# Patient Record
Sex: Female | Born: 2000 | Race: Black or African American | Hispanic: No | Marital: Single | Smoking: Never smoker
Health system: Southern US, Community
[De-identification: ages and names within clinical notes are randomized; demographics above are authoritative.]

## PROBLEM LIST (undated history)

## (undated) ENCOUNTER — Inpatient Hospital Stay (HOSPITAL_COMMUNITY): Payer: Self-pay

## (undated) ENCOUNTER — Ambulatory Visit: Admission: EM | Discharge status: Absent without leave | Payer: Medicaid Other

## (undated) DIAGNOSIS — J302 Other seasonal allergic rhinitis: Secondary | ICD-10-CM

## (undated) DIAGNOSIS — R7303 Prediabetes: Secondary | ICD-10-CM

## (undated) DIAGNOSIS — J45909 Unspecified asthma, uncomplicated: Secondary | ICD-10-CM

## (undated) HISTORY — PX: COMBINED REDUCTION MAMMAPLASTY W/ ABDOMINOPLASTY: SUR306

## (undated) HISTORY — DX: Unspecified asthma, uncomplicated: J45.909

## (undated) HISTORY — DX: Other seasonal allergic rhinitis: J30.2

---
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
## 2021-01-29 DIAGNOSIS — Z5181 Encounter for therapeutic drug level monitoring: Secondary | ICD-10-CM | POA: Insufficient documentation

## 2021-05-29 ENCOUNTER — Encounter: Payer: Self-pay | Admitting: Registered"

## 2021-05-29 ENCOUNTER — Other Ambulatory Visit: Payer: Self-pay

## 2021-05-29 ENCOUNTER — Encounter: Payer: Medicaid Other | Attending: Physician Assistant | Admitting: Registered"

## 2021-05-29 DIAGNOSIS — R635 Abnormal weight gain: Secondary | ICD-10-CM | POA: Diagnosis present

## 2021-05-29 NOTE — Patient Instructions (Signed)
-   Aim to have 1/2 plate of non-starchy vegetables + 1/4 plate of carbohydrates + 1/4 plate of lean protein with lunch and dinner daily.   - Aim to have source of protein and carbohydrates with breakfast daily.  Ex. Bagel + peanut butter + [place] bacon + water  - Aim to have at least 3 meals a day.   - Drink a bottle of water with each meal.   - Great job with with physical activity!

## 2021-05-29 NOTE — Progress Notes (Signed)
Medical Nutrition Therapy  Appointment Start time:  8:35  Appointment End time:  9:45  Primary concerns today: how to eat healthier  Referral diagnosis: abnormal weight gain, excessive hunger Preferred learning style:  no preference indicated Learning readiness: ready, change in progress   NUTRITION ASSESSMENT   Pt arrives stating she gets boils when she eats red meat. States was told that her blood sugar numbers are elevated to almost being in diabetes range; states A1c was abnormal. States she did not originally know A1c value but logged into mobile account during our appt and A1c result from 04/24/21 was 6.6. States she will return to PCP in 3 months (07/2021) for follow-up.   States she has increased gym regimen since finding out about A1c results. Reports going to gym for 2-3 hours, 4-5 times/week. States she tries to drink more smoothies. States she does not know what to eat, how much to eat, how to lose the weight and keep it off. States she has never been like a meat person. States she has been trying to drink more water. States she was not drinking a lot of water or does not drink because she is not thirsty. States she typically drinks coffee (no sugar), water, Powerade, or Glucerna. States she tries to drink a smoothie or eat a little bit of something before going to gym to not feel sick. States she tries not to eat after 7 pm but also tries not to eat anything heavy after 10 pm. States she doesn't eat sweets because it upsets her stomach.    Clinical Medical Hx: none listed Medications: See list Labs: elevated A1c (6.6) Notable Signs/Symptoms: increased hunger  Lifestyle & Dietary Hx  Estimated daily fluid intake: 10 oz Supplements: See list Sleep: 7-8 hrs/night Stress / self-care: gym  Current average weekly physical activity: cardio + strength training 60+ min, 4-5 days/week  Safe foods: shrimp, salads, yams, macaroni and cheese, mashed potatoes, greens, potato salad, chicken  (fried or grilled), fish (fried or baked), salami, [place] bacon, chicken alfredo, broccoli, broccoli and cheese, fruit,   Avoided foods: beef, cheese, yogurt  24-Hr Dietary Recall First Meal: Glucerna + cup of fruit Snack:  Second Meal: Red Crab - 5 boiled shrimp + red potato + corn on cob Snack:  Third Meal: Red Crab - 5 boiled shrimp Snack:  Beverages: Glucerna, water (16-20 oz)   NUTRITION DIAGNOSIS  NB-1.1 Food and nutrition-related knowledge deficit As related to elevated A1c.  As evidenced by pt verbalizes lack of previous nutrition-related education.   NUTRITION INTERVENTION  Nutrition education (E-1) on the following topics: Pt was educated and counseled on metabolism, importance of not skipping meals, how carbohydrates work in the body, A1c ranges for prediabetes/diabetes, role of fiber in eating regimen, and importance of physical activity. Discussed meal planning and how to balance meals using MyPlate for prediabetes. Pt agreed with goals listed.  Handouts Provided Include  My Plate for Prediabetes  Learning Style & Readiness for Change Teaching method utilized: Visual & Auditory  Demonstrated degree of understanding via: Teach Back  Barriers to learning/adherence to lifestyle change: none identified  Goals Established by Pt Aim to have 1/2 plate of non-starchy vegetables + 1/4 plate of carbohydrates + 1/4 plate of lean protein with lunch and dinner daily.  Aim to have source of protein and carbohydrates with breakfast daily.  Ex. Bagel + peanut butter + [place] bacon + water Aim to have at least 3 meals a day.  Drink a bottle of  water with each meal.  Great job with with physical activity!   MONITORING & EVALUATION Dietary intake, weekly physical activity.  Next Steps  Patient is to follow-up in 4 weeks.

## 2021-06-12 ENCOUNTER — Telehealth: Payer: Medicaid Other | Admitting: Family

## 2021-06-12 DIAGNOSIS — B349 Viral infection, unspecified: Secondary | ICD-10-CM

## 2021-06-12 DIAGNOSIS — R6889 Other general symptoms and signs: Secondary | ICD-10-CM

## 2021-06-12 MED ORDER — PREDNISONE 10 MG (21) PO TBPK: ORAL_TABLET | ORAL | 0 refills | Status: DC

## 2021-06-12 MED ORDER — BENZONATATE 100 MG PO CAPS: 100.0000 mg | ORAL_CAPSULE | Freq: Three times a day (TID) | ORAL | 0 refills | Status: DC | PRN

## 2021-06-12 NOTE — Progress Notes (Signed)
Virtual Visit Consent   [name], you are scheduled for a virtual visit with a [facility] provider today.     Just as with appointments in the office, your consent must be obtained to participate.  Your consent will be active for this visit and any virtual visit you may have with one of our providers in the next 365 days.     If you have a MyChart account, a copy of this consent can be sent to you electronically.  All virtual visits are billed to your insurance company just like a traditional visit in the office.    As this is a virtual visit, video technology does not allow for your provider to perform a traditional examination.  This may limit your provider's ability to fully assess your condition.  If your provider identifies any concerns that need to be evaluated in person or the need to arrange testing (such as labs, EKG, etc.), we will make arrangements to do so.     Although advances in technology are sophisticated, we cannot ensure that it will always work on either your end or our end.  If the connection with a video visit is poor, the visit may have to be switched to a telephone visit.  With either a video or telephone visit, we are not always able to ensure that we have a secure connection.     I need to obtain your verbal consent now.   Are you willing to proceed with your visit today?    RIANA Sanders has provided verbal consent on 06/12/2021 for a virtual visit (video or telephone).   Jannifer Rodney, FNP   Date: 06/12/2021 10:12 AM   Virtual Visit via Video Note   I, Jannifer Rodney, connected with  Anna Sanders  ([id number], 21-Oct-2000) on 06/12/21 at 10:15 AM EST by a video-enabled telemedicine application and verified that I am speaking with the correct person using two identifiers.  Location: Patient: Virtual Visit Location Patient: Home Provider: Virtual Visit Location Provider: Home Office   I discussed the limitations of evaluation and management by  telemedicine and the availability of in person appointments. The patient expressed understanding and agreed to proceed.    History of Present Illness: Anna Sanders is a 20 y.o. who identifies as a female who was assigned female at birth, and is being seen today for headache, sore throat, body pain that started last week. She went to her doctor on 06/05/21 and was negative for COVID and Flu. She was give albuterol. She completed doxycyline 05/29/21.  HPI: Cough This is a new problem. The current episode started 1 to 4 weeks ago. The problem has been unchanged. Associated symptoms include chills, a fever, headaches, myalgias, nasal congestion, postnasal drip and wheezing. Pertinent negatives include no ear congestion, ear pain or shortness of breath.   Problems: There are no problems to display for this patient.   Allergies:  Allergies  Allergen Reactions   Apple     Face breaks out   Latex     itching   Medications:  Current Outpatient Medications:    benzonatate (TESSALON PERLES) 100 MG capsule, Take 1 capsule (100 mg total) by mouth 3 (three) times daily as needed., Disp: 20 capsule, Rfl: 0   predniSONE (STERAPRED UNI-PAK 21 TAB) 10 MG (21) TBPK tablet, Use as directed, Disp: 21 tablet, Rfl: 0   albuterol (VENTOLIN HFA) 108 (90 Base) MCG/ACT inhaler, Inhale into the lungs every 6 (six) hours as  needed for wheezing or shortness of breath., Disp: , Rfl:    doxycycline (VIBRAMYCIN) 100 MG capsule, Take 100 mg by mouth 2 (two) times daily., Disp: , Rfl:    etonogestrel (NEXPLANON) 68 MG IMPL implant, 1 each by Subdermal route once., Disp: , Rfl:    fexofenadine-pseudoephedrine (ALLEGRA-D 24) 180-240 MG 24 hr tablet, Take 1 tablet by mouth daily., Disp: , Rfl:    fluticasone (FLOVENT HFA) 44 MCG/ACT inhaler, Inhale into the lungs 2 (two) times daily., Disp: , Rfl:    ibuprofen (ADVIL,MOTRIN) 600 MG tablet, Take 1 tablet (600 mg total) by mouth every 6 (six) hours as needed for mild pain or  moderate pain., Disp: 30 tablet, Rfl: 0   montelukast (SINGULAIR) 10 MG tablet, Take 10 mg by mouth at bedtime., Disp: , Rfl:    Multiple Vitamins-Minerals (MULTIVITAMIN WITH MINERALS) tablet, Take 1 tablet by mouth daily., Disp: , Rfl:    silver sulfADIAZINE (SILVADENE) 1 % cream, Apply 1 application topically daily., Disp: , Rfl:    spironolactone (ALDACTONE) 50 MG tablet, Take 50 mg by mouth daily., Disp: , Rfl:   Observations/Objective: Patient is well-developed, well-nourished in no acute distress.  Resting comfortably  at home.  Head is normocephalic, atraumatic.  No labored breathing.  Speech is clear and coherent with logical content.  Patient is alert and oriented at baseline.  Nasal congestion  Assessment and Plan: 1. Flu-like symptoms - predniSONE (STERAPRED UNI-PAK 21 TAB) 10 MG (21) TBPK tablet; Use as directed  Dispense: 21 tablet; Refill: 0 - benzonatate (TESSALON PERLES) 100 MG capsule; Take 1 capsule (100 mg total) by mouth 3 (three) times daily as needed.  Dispense: 20 capsule; Refill: 0  2. Viral illness - predniSONE (STERAPRED UNI-PAK 21 TAB) 10 MG (21) TBPK tablet; Use as directed  Dispense: 21 tablet; Refill: 0 - benzonatate (TESSALON PERLES) 100 MG capsule; Take 1 capsule (100 mg total) by mouth 3 (three) times daily as needed.  Dispense: 20 capsule; Refill: 0 - Take meds as prescribed - Use a cool mist humidifier  -Use saline nose sprays frequently -Force fluids -For fever or aces or pains- take tylenol or ibuprofen. -Throat lozenges if help Continue Mucinex  Low carb diet  Follow Up Instructions: I discussed the assessment and treatment plan with the patient. The patient was provided an opportunity to ask questions and all were answered. The patient agreed with the plan and demonstrated an understanding of the instructions.  A copy of instructions were sent to the patient via MyChart unless otherwise noted below.     The patient was advised to call back  or seek an in-person evaluation if the symptoms worsen or if the condition fails to improve as anticipated.  Time:  I spent 13 minutes with the patient via telehealth technology discussing the above problems/concerns.    Jannifer Rodney, FNP

## 2021-06-26 ENCOUNTER — Encounter: Payer: Self-pay | Admitting: Registered"

## 2021-06-26 ENCOUNTER — Encounter: Payer: Medicaid Other | Attending: Physician Assistant | Admitting: Registered"

## 2021-06-26 ENCOUNTER — Other Ambulatory Visit: Payer: Self-pay

## 2021-06-26 DIAGNOSIS — R635 Abnormal weight gain: Secondary | ICD-10-CM | POA: Insufficient documentation

## 2021-06-26 NOTE — Progress Notes (Signed)
Medical Nutrition Therapy  Appointment Start time:  9:15  Appointment End time: 9:48  Primary concerns today: how to eat healthier  Referral diagnosis: abnormal weight gain, excessive hunger Preferred learning style:  no preference indicated Learning readiness: ready, change in progress   NUTRITION ASSESSMENT   Pt arrives stating she went to doctor recently because she was feeling sick. Reports her A1c decreased to 6.3. Reports she was having headaches, nausea, dizziness, and emotional bursts of crying. States on the first occasion, she drunk lemonade and she felt better. States on the second occasion she felt like "her body would just drop", she ate food and it didn't help. Then she ate some cake and it helped a little. States she doesn't remember what she ate throughout the day on the days  when it sounds like hypoglycemic episodes were happening.   States she has been doing well balancing meals prior to these episodes. States she has been sick for 2 weeks - has not been able to eat breakfast or go to the gym lately. States she is trying to reintroduce gym into her week. Reports she is still drinking Glucerna as snack and has not been drinking Tropical Smoothie as often.    Clinical Medical Hx: none listed Medications: See list Labs: elevated A1c (6.3); decreased from 6.6 to 6.6 Notable Signs/Symptoms: increased hunger  Lifestyle & Dietary Hx  Estimated daily fluid intake: 64+ oz Supplements: See list Sleep: 7-8 hrs/night Stress / self-care: gym  Current average weekly physical activity: working to resume cardio + strength training 60+ min, 4-5 days/week  Safe foods: shrimp, salads, yams, macaroni and cheese, mashed potatoes, greens, potato salad, chicken (fried or grilled), fish (fried or baked), salami, [place] bacon, chicken alfredo, broccoli, broccoli and cheese, fruit   Avoided foods: beef, cheese, yogurt  24-Hr Dietary Recall First Meal: toast + PB + pork bacon + water   Snack:  Second Meal: salad (with croutons, seeds, bacon, chicken, cheese) Snack:  Third Meal: baked/fried fish + rice + 1 biscuit or casserole (rice, chicken/shrimp, broccoli, cheese) or [employer]-steak and cheese sandwich Snack:  Beverages: Glucerna, water (2-3*32 oz); 64+ oz   NUTRITION DIAGNOSIS  NB-1.1 Food and nutrition-related knowledge deficit As related to elevated A1c.  As evidenced by pt verbalizes lack of previous nutrition-related education.   NUTRITION INTERVENTION  Nutrition education (E-1) on the following topics: Pt was encouraged with changes made from previous visit. Pt was educated and counseled on signs and symptoms of hypoglycemia, how to treat hypoglycemia, and discussed ways to continue to aim for balance in meals.snacks. Pt agreed with goals listed.  Handouts Provided Include  How to Thrive: A Guide for Your Journey with Diabetes  Learning Style & Readiness for Change Teaching method utilized: Visual & Auditory  Demonstrated degree of understanding via: Teach Back  Barriers to learning/adherence to lifestyle change: none identified  Goals Established by Pt Have 1/2 cup of juice, soda, or a few pieces of hard candy when feeling like blood sugar is dropping. See pages 16-17 in handbook.  Continue to balance meals with 1/2 plate of non-starchy vegetables along with protein and carbohydrates.  Great job having 3 meals a day and snacks as needed.    MONITORING & EVALUATION Dietary intake, weekly physical activity.  Next Steps  Patient is to follow-up in 7 weeks.

## 2021-06-26 NOTE — Patient Instructions (Addendum)
-   Have 1/2 cup of juice, soda, or a few pieces of hard candy when feeling like blood sugar is dropping. See pages 16-17 in handbook.   - Continue to balance meals with 1/2 plate of non-starchy vegetables along with protein and carbohydrates.   Anna Sanders job having 3 meals a day and snacks as needed.

## 2021-08-06 DIAGNOSIS — E8881 Metabolic syndrome: Secondary | ICD-10-CM | POA: Insufficient documentation

## 2021-08-06 DIAGNOSIS — E88819 Insulin resistance, unspecified: Secondary | ICD-10-CM | POA: Insufficient documentation

## 2021-08-13 ENCOUNTER — Encounter: Payer: Medicaid Other | Attending: Physician Assistant | Admitting: Registered"

## 2021-08-13 ENCOUNTER — Encounter: Payer: Self-pay | Admitting: Registered"

## 2021-08-13 ENCOUNTER — Other Ambulatory Visit: Payer: Self-pay

## 2021-08-13 DIAGNOSIS — R635 Abnormal weight gain: Secondary | ICD-10-CM | POA: Insufficient documentation

## 2021-08-13 NOTE — Patient Instructions (Signed)
-   Great job having 3 meals a day and snacks as needed.

## 2021-08-13 NOTE — Progress Notes (Signed)
Medical Nutrition Therapy  Appointment Start time:  10:15  Appointment End time: 10:55  Primary concerns today: how to eat healthier  Referral diagnosis: abnormal weight gain, excessive hunger Preferred learning style:  no preference indicated Learning readiness: ready, change in progress   NUTRITION ASSESSMENT   Pt arrives stating her A1c has decreased to 6.1 since our previous visit. States she was told to keep following eating plan and exercising. Reports dermatologist told her insulin was is likely causing her to have boil outbreaks during her visit in 02/2021. Reported if insulin is still high at next visit, pt will be prescribed metformin and/or another medication requiring patient to stick herself. Pt states she is fearful of sticking herself. States she is getting discouraged by not seeing weight loss on the scale when going to medical appointments although she is making changes. States insulin is lower now and things are well. Reports improved insulin levels which were reviewed at recent dermatology appointment.   States she had stomach ache after breakfast yesterday, ate breakfast and didn't eat again until after work. States the medication from dermatologist makes her stomach upset. States she was not taking it often and started back taking yesterday; was informed to be consistent in taking it daily to help prevent boils.   Pt states she works at a daycare.    Clinical Medical Hx: none listed Medications: See list Labs: elevated A1c (6.1); decreased from 6.3 to 6.1 Notable Signs/Symptoms: increased hunger  Lifestyle & Dietary Hx  Estimated daily fluid intake: 64+ oz Supplements: See list Sleep: 7-8 hrs/night Stress / self-care: gym  Current average weekly physical activity: working to resume cardio + strength training 60+ min, 4-5 days/week  Safe foods: shrimp, salads, yams, macaroni and cheese, mashed potatoes, greens, potato salad, chicken (fried or grilled), fish (fried  or baked), salami, [place] bacon, chicken alfredo, broccoli, broccoli and cheese, fruit   Avoided foods: beef, cheese, yogurt  24-Hr Dietary Recall First Meal: 1 bowl cereal (honey roasted oats) + Lactaid skim milk or toast + PB + pork bacon + water  Snack:  Second Meal: skipped due to stomach discomfort or salad (with croutons, seeds, bacon, chicken, cheese) Snack:  Third Meal (5-6 pm): onion rings + ginger ale Snack (9-10 ppm): roast with mixed vegetables + slushie Beverages: ginger al (16.9 oz), milk, slushie, water (12 oz);    NUTRITION DIAGNOSIS  NB-1.1 Food and nutrition-related knowledge deficit As related to elevated A1c.  As evidenced by pt verbalizes lack of previous nutrition-related education.   NUTRITION INTERVENTION  Nutrition education (E-1) on the following topics: Pt was encouraged with changes made from previous visit. Educated and discussed diet culture, weight, and how glucose and insulin work in the body. Pt agreed with goals listed.  Handouts Provided Include  none  Learning Style & Readiness for Change Teaching method utilized: Visual & Auditory  Demonstrated degree of understanding via: Teach Back  Barriers to learning/adherence to lifestyle change: none identified  Goals Established by Pt Great job having 3 meals a day and snacks as needed.    MONITORING & EVALUATION Dietary intake, weekly physical activity.  Next Steps  Patient is to follow-up in 2.5 months.

## 2021-10-21 ENCOUNTER — Encounter (HOSPITAL_COMMUNITY): Payer: Self-pay

## 2021-10-21 ENCOUNTER — Other Ambulatory Visit: Payer: Self-pay

## 2021-10-21 ENCOUNTER — Emergency Department (HOSPITAL_COMMUNITY)
Admission: EM | Admit: 2021-10-21 | Discharge: 2021-10-21 | Disposition: A | Discharge status: Home / Self Care | Payer: Medicaid Other | Attending: Emergency Medicine | Admitting: Emergency Medicine

## 2021-10-21 DIAGNOSIS — L02412 Cutaneous abscess of left axilla: Secondary | ICD-10-CM | POA: Diagnosis present

## 2021-10-21 DIAGNOSIS — Z91040 Latex allergy status: Secondary | ICD-10-CM | POA: Diagnosis not present

## 2021-10-21 DIAGNOSIS — Z79899 Other long term (current) drug therapy: Secondary | ICD-10-CM | POA: Insufficient documentation

## 2021-10-21 MED ORDER — LIDOCAINE-EPINEPHRINE (PF) 2 %-1:200000 IJ SOLN
10.0000 mL | Freq: Once | INTRAMUSCULAR | Status: AC
  Administered 2021-10-21: 10 mL
  Filled 2021-10-21: qty 20

## 2021-10-21 NOTE — Discharge Instructions (Signed)
Please take Tylenol as needed for management of your pain.  Please follow the instructions on the bottle.  I would recommend that you continue to apply warm compresses.  Please follow-up with your dermatologist regarding your symptoms as well as this visit.  If you develop any new or worsening symptoms such as fevers, chills, nausea, vomiting, worsening pain/swelling, or just generally worsening symptoms, please come back to the emergency department for reevaluation. ?

## 2021-10-21 NOTE — ED Provider Notes (Signed)
?[facility]-EMERGENCY DEPT ?Provider Note ? ? ?CSN: [id number] ?Arrival date & time: 10/21/21  0006 ? ?  ? ?History ? ?Chief Complaint  ?Patient presents with  ? Abscess  ? ? ?Anna Sanders is a 21 y.o. female. ? ?HPI ?Patient is a 21 year old female with a history of seasonal allergies as well as hidradenitis suppurativa who presents to the emergency department due to an abscess to her left axilla.  States it started about 2 days ago.  She has been applying warm compresses to the region with little relief.  States that she has never required an I&D in the past.  She is followed by dermatology for this and is taking spironolactone daily as well as doxycycline as needed.  She took 2 doses of doxycycline earlier today with little relief.  Denies any fevers, chills, nausea, vomiting.  Also notes a history of seasonal allergies and states that she has been more congested than normal over the past couple of weeks and when blowing her nose sometimes notices small traces of blood. ?  ? ?Home Medications ?Prior to Admission medications   ?Medication Sig Start Date End Date Taking? Authorizing Provider  ?albuterol (VENTOLIN HFA) 108 (90 Base) MCG/ACT inhaler Inhale into the lungs every 6 (six) hours as needed for wheezing or shortness of breath.    [provider]  ?benzonatate (TESSALON PERLES) 100 MG capsule Take 1 capsule (100 mg total) by mouth 3 (three) times daily as needed. 06/12/21   Junie Spencer, FNP  ?doxycycline (VIBRAMYCIN) 100 MG capsule Take 100 mg by mouth 2 (two) times daily.    [provider]  ?etonogestrel (NEXPLANON) 68 MG IMPL implant 1 each by Subdermal route once.    [provider]  ?fexofenadine-pseudoephedrine (ALLEGRA-D 24) 180-240 MG 24 hr tablet Take 1 tablet by mouth daily.    [provider]  ?fluticasone (FLOVENT HFA) 44 MCG/ACT inhaler Inhale into the lungs 2 (two) times daily.    [provider]  ?ibuprofen (ADVIL,MOTRIN)  600 MG tablet Take 1 tablet (600 mg total) by mouth every 6 (six) hours as needed for mild pain or moderate pain. 09/13/17   Lowanda Foster, NP  ?montelukast (SINGULAIR) 10 MG tablet Take 10 mg by mouth at bedtime.    [provider]  ?Multiple Vitamins-Minerals (MULTIVITAMIN WITH MINERALS) tablet Take 1 tablet by mouth daily.    [provider]  ?predniSONE (STERAPRED UNI-PAK 21 TAB) 10 MG (21) TBPK tablet Use as directed 06/12/21   Junie Spencer, FNP  ?silver sulfADIAZINE (SILVADENE) 1 % cream Apply 1 application topically daily.    [provider]  ?spironolactone (ALDACTONE) 50 MG tablet Take 50 mg by mouth daily.    [provider]  ?   ? ?Allergies    ?Apple juice and Latex   ? ?Review of Systems   ?Review of Systems  ?Constitutional:  Negative for chills and fever.  ?HENT:  Positive for congestion.   ?Gastrointestinal:  Negative for nausea and vomiting.  ?Musculoskeletal:  Positive for myalgias.  ?Skin:  Positive for color change and rash.  ? ?Physical Exam ?Updated Vital Signs ?BP (!) 141/105   Pulse (!) 102   Temp 98.4 ?F (36.9 ?C) (Oral)   Resp 15   SpO2 98%  ?Physical Exam ?Vitals and nursing note reviewed.  ?Constitutional:   ?   General: She is not in acute distress. ?   Appearance: She is well-developed.  ?HENT:  ?   Head:  Normocephalic and atraumatic.  ?   Right Ear: External ear normal.  ?   Left Ear: External ear normal.  ?Eyes:  ?   General: No scleral icterus.    ?   Right eye: No discharge.     ?   Left eye: No discharge.  ?   Conjunctiva/sclera: Conjunctivae normal.  ?Neck:  ?   Trachea: No tracheal deviation.  ?Cardiovascular:  ?   Rate and Rhythm: Normal rate.  ?Pulmonary:  ?   Effort: Pulmonary effort is normal. No respiratory distress.  ?   Breath sounds: No stridor.  ?Abdominal:  ?   General: There is no distension.  ?Musculoskeletal:     ?   General: Tenderness present. No swelling or deformity.  ?   Cervical back: Neck supple.  ?Skin: ?   General:  Skin is warm and dry.  ?   Findings: Abscess and erythema present. No rash.  ?   Comments: 3 cm of palpable fluctuance with surrounding erythema noted on the left axilla.  No active drainage.  ?Neurological:  ?   Mental Status: She is alert.  ?   Cranial Nerves: Cranial nerve deficit: no gross deficits.  ? ?ED Results / Procedures / Treatments   ?Labs ?(all labs ordered are listed, but only abnormal results are displayed) ?Labs Reviewed - No data to display ? ?EKG ?None ? ?Radiology ?No results found. ? ?Procedures ?Marland Kitchen.Incision and Drainage ? ?Date/Time: 10/21/2021 2:06 AM ?Performed by: Placido Sou, PA-C ?Authorized by: Placido Sou, PA-C  ? ?Consent:  ?  Consent obtained:  Verbal ?  Consent given by:  Patient ?  Risks discussed:  Bleeding, incomplete drainage, pain and infection ?Universal protocol:  ?  Procedure explained and questions answered to patient or proxy's satisfaction: yes   ?  Relevant documents present and verified: yes   ?  Test results available : yes   ?  Imaging studies available: yes   ?  Required blood products, implants, devices, and special equipment available: yes   ?  Site/side marked: yes   ?  Immediately prior to procedure, a time out was called: yes   ?  Patient identity confirmed:  Verbally with patient ?Location:  ?  Size:  3 cm ?  Location: Left axilla. ?Pre-procedure details:  ?  Skin preparation:  Chlorhexidine with alcohol ?Sedation:  ?  Sedation type:  None ?Anesthesia:  ?  Anesthesia method:  Local infiltration ?  Local anesthetic:  Lidocaine 2% WITH epi ?Procedure type:  ?  Complexity:  Complex ?Procedure details:  ?  Ultrasound guidance: no   ?  Needle aspiration: no   ?  Incision types:  Stab incision ?  Incision depth:  Dermal ?  Wound management:  Probed and deloculated ?  Drainage:  Purulent ?  Drainage amount:  Copious ?  Wound treatment:  Wound left open ?  Packing materials:  None ?Post-procedure details:  ?  Procedure completion:  Tolerated well, no immediate  complications  ? ?Medications Ordered in ED ?Medications  ?lidocaine-EPINEPHrine (XYLOCAINE W/EPI) 2 %-1:200000 (PF) injection 10 mL (10 mLs Infiltration Given by Other 10/21/21 0149)  ? ?ED Course/ Medical Decision Making/ A&P ?  ?                        ?Medical Decision Making ?Risk ?Prescription drug management. ? ?Patient is a 21 year old female with a history of hidradenitis suppurativa who presents to the emergency department with an  abscess to the left axilla.  Symptoms started about 2 days ago. ? ?On my exam patient has a 3 cm palpable fluctuance in the left axilla with surrounding erythema.  Moderate tenderness noted in the region.  She is afebrile and nontoxic-appearing.  Denies any systemic symptoms such as fevers, chills, nausea, or vomiting.  States that she has been applying warm compresses without relief. ? ?I&D performed producing copious purulent discharge.  Patient tolerated the procedure well.  Please see procedure note above for additional information. ? ?Patient appears stable for discharge at this time and she is agreeable.  Recommended Tylenol for management of her pain.  Continue warm compresses.  Follow-up with her dermatologist.  Discussed return precautions.  Her questions were answered and she was amicable at the time of discharge. ?Final Clinical Impression(s) / ED Diagnoses ?Final diagnoses:  ?Abscess of left axilla  ? ?Rx / DC Orders ?ED Discharge Orders   ? ? None  ? ?  ? ? ?  ?Placido SouJoldersma, Simra Fiebig, PA-C ?10/21/21 0208 ? ?  ?Zadie RhineWickline, Donald, MD ?10/21/21 [phone] ? ?

## 2021-10-21 NOTE — ED Triage Notes (Signed)
Patient arrived with complaints of a right axilla abscess. Declines any drainage from site.  ?

## 2021-10-22 ENCOUNTER — Encounter: Payer: Self-pay | Admitting: Registered"

## 2021-10-22 ENCOUNTER — Encounter: Payer: Medicaid Other | Attending: Physician Assistant | Admitting: Registered"

## 2021-10-22 DIAGNOSIS — R635 Abnormal weight gain: Secondary | ICD-10-CM | POA: Diagnosis present

## 2021-10-22 DIAGNOSIS — Z713 Dietary counseling and surveillance: Secondary | ICD-10-CM | POA: Insufficient documentation

## 2021-10-22 DIAGNOSIS — R632 Polyphagia: Secondary | ICD-10-CM | POA: Insufficient documentation

## 2021-10-22 DIAGNOSIS — E663 Overweight: Secondary | ICD-10-CM | POA: Insufficient documentation

## 2021-10-22 NOTE — Patient Instructions (Addendum)
-   Aim to increase water intake to at least 64 ounces a day. ? ?- Try to have balanced meals. Include vegetables at least once a day.  ? ?

## 2021-10-22 NOTE — Progress Notes (Signed)
Medical Nutrition Therapy  ?Appointment Start time:  11:20  Appointment End time: 11:48 ? ?Primary concerns today: how to eat healthier  ?Referral diagnosis: abnormal weight gain, excessive hunger ?Preferred learning style:  no preference indicated ?Learning readiness: ready, change in progress ? ? ?NUTRITION ASSESSMENT  ? ?Pt states she has been using the gym on campus and no longer going to off-campus gym. States she is supposed to have a follow-up with dermatologist in 01/2022. States she has not had insulin levels or A1c checked since previous visit in 07/2021.  ? ?States yesterday she wasn't feeling well due to taking boil medication and pain medication. States it made her sleep throughout the day.  ? ?States it is hard to eat healthy while living on campus. States she only drinks soda sometimes when eating out. States the food on campus is not that good to her. States she will eat a chicken caesar wrap when going to [employer] or sometimes eat meals at her mom's house. States mom has not been cooking that much recently. Reports when the semester ends in May, she will return to mom's house. States it will be easier to implement eating changes while living there.  ? ?Previous appt: Reports dermatologist told her insulin was is likely causing her to have boil outbreaks during her visit in 02/2021. Reported if insulin is still high at next visit, pt will be prescribed metformin and/or another medication requiring patient to stick herself. Pt states she is fearful of sticking herself. States she is getting discouraged by not seeing weight loss on the scale when going to medical appointments although she is making changes. States insulin is lower now and things are well. Reports improved insulin levels which were reviewed at recent dermatology appointment.  ? ?States she had stomach ache after breakfast yesterday, ate breakfast and didn't eat again until after work. States the medication from dermatologist makes her  stomach upset. States she was not taking it often and started back taking yesterday; was informed to be consistent in taking it daily to help prevent boils.  ? ?Pt states she works at a daycare.  ? ? ?Clinical ?Medical Hx: none listed ?Medications: See list ?Labs: elevated A1c (6.1); decreased from 6.3 to 6.1 ?Notable Signs/Symptoms: increased hunger ? ?Lifestyle & Dietary Hx ? ?Estimated daily fluid intake: 64+ oz ?Supplements: See list ?Sleep: 7-8 hrs/night ?Stress / self-care: gym  ?Current average weekly physical activity: working to resume cardio + strength training 60+ min, 4-5 days/week ? ?Safe foods: shrimp, salads, yams, macaroni and cheese, mashed potatoes, greens, potato salad, chicken (fried or grilled), fish (fried or baked), salami, [place] bacon, chicken alfredo, broccoli, broccoli and cheese, fruit  ? ?Avoided foods: beef, cheese, yogurt ? ?24-Hr Dietary Recall (feeling sick yesterday): ?First Meal: skipped   ?Snack:  ?Second Meal (2-3 pm): frozen meal - chicken nuggets + mac and cheese + brownie + apple juice ?Snack:  ?Third Meal (5-6 pm): skipped due to sleeping or onion rings + ginger ale ?Snack (9-10 ppm):  ?Beverages: apple juice (6-8 oz)  ? ? ?NUTRITION DIAGNOSIS  ?NB-1.1 Food and nutrition-related knowledge deficit As related to elevated A1c.  As evidenced by pt verbalizes lack of previous nutrition-related education. ? ? ?NUTRITION INTERVENTION  ?Nutrition education (E-1) on the following topics: Pt was encouraged with changes made from previous visit. Educated and discussed diet culture, weight, and how glucose and insulin work in the body. Pt agreed with goals listed. ? ?Handouts Provided Include  ?none ? ?Learning Style &  Readiness for Change ?Teaching method utilized: Visual & Auditory  ?Demonstrated degree of understanding via: Teach Back  ?Barriers to learning/adherence to lifestyle change: none identified ? ?Goals Established by Pt ?Aim to increase water intake to at least 64 ounces a  day. ?Try to have balanced meals. Include vegetables at least once a day.   ? ? ?MONITORING & EVALUATION ?Dietary intake, weekly physical activity. ? ?Next Steps  ?Patient is to follow-up in 2.5 months. ? ?

## 2021-11-14 ENCOUNTER — Ambulatory Visit (INDEPENDENT_AMBULATORY_CARE_PROVIDER_SITE_OTHER): Payer: Medicaid Other

## 2021-11-14 ENCOUNTER — Ambulatory Visit
Admission: RE | Admit: 2021-11-14 | Discharge: 2021-11-14 | Disposition: A | Discharge status: Home / Self Care | Payer: Medicaid Other | Source: Ambulatory Visit | Attending: Internal Medicine | Admitting: Internal Medicine

## 2021-11-14 ENCOUNTER — Ambulatory Visit
Admission: EM | Admit: 2021-11-14 | Discharge: 2021-11-14 | Disposition: A | Discharge status: Home / Self Care | Payer: Medicaid Other | Attending: Emergency Medicine | Admitting: Emergency Medicine

## 2021-11-14 VITALS — BP 111/79 | HR 67 | Temp 97.8°F | Resp 18 | Ht 59.0 in

## 2021-11-14 DIAGNOSIS — M79602 Pain in left arm: Secondary | ICD-10-CM

## 2021-11-14 DIAGNOSIS — N939 Abnormal uterine and vaginal bleeding, unspecified: Secondary | ICD-10-CM | POA: Diagnosis present

## 2021-11-14 DIAGNOSIS — Z5321 Procedure and treatment not carried out due to patient leaving prior to being seen by health care provider: Secondary | ICD-10-CM

## 2021-11-14 DIAGNOSIS — M25522 Pain in left elbow: Secondary | ICD-10-CM | POA: Diagnosis not present

## 2021-11-14 HISTORY — DX: Prediabetes: R73.03

## 2021-11-14 LAB — POCT URINALYSIS DIP (MANUAL ENTRY)
Bilirubin, UA: NEGATIVE
Glucose, UA: NEGATIVE mg/dL
Ketones, POC UA: NEGATIVE mg/dL
Leukocytes, UA: NEGATIVE
Nitrite, UA: NEGATIVE
Spec Grav, UA: 1.025 (ref 1.010–1.025)
Urobilinogen, UA: 0.2 E.U./dL
pH, UA: 6 (ref 5.0–8.0)

## 2021-11-14 LAB — POCT URINE PREGNANCY: Preg Test, Ur: NEGATIVE

## 2021-11-14 NOTE — Discharge Instructions (Addendum)
Your x-rays were normal.  You may use ice application and take over-the-counter pain relievers as needed.  Follow-up with gynecology for further evaluation and management of your abnormal vaginal bleeding. ?
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
## 2021-11-14 NOTE — ED Provider Notes (Signed)
Walked into the room, patient not there.  She left prior to my evaluation ?  ?Domenick Gong, MD ?11/14/21 1219 ? ?

## 2021-11-15 LAB — CERVICOVAGINAL ANCILLARY ONLY
Bacterial Vaginitis (gardnerella): NEGATIVE
Candida Glabrata: NEGATIVE
Candida Vaginitis: NEGATIVE
Chlamydia: NEGATIVE
Comment: NEGATIVE
Comment: NEGATIVE
Comment: NEGATIVE
Comment: NEGATIVE
Comment: NEGATIVE
Comment: NORMAL
Neisseria Gonorrhea: NEGATIVE
Trichomonas: NEGATIVE

## 2021-12-09 ENCOUNTER — Ambulatory Visit (INDEPENDENT_AMBULATORY_CARE_PROVIDER_SITE_OTHER): Payer: Medicaid Other | Admitting: Primary Care

## 2021-12-09 ENCOUNTER — Encounter (INDEPENDENT_AMBULATORY_CARE_PROVIDER_SITE_OTHER): Payer: Self-pay | Admitting: Primary Care

## 2021-12-12 ENCOUNTER — Ambulatory Visit (INDEPENDENT_AMBULATORY_CARE_PROVIDER_SITE_OTHER): Payer: Medicaid Other | Admitting: Primary Care

## 2021-12-12 ENCOUNTER — Encounter (INDEPENDENT_AMBULATORY_CARE_PROVIDER_SITE_OTHER): Payer: Self-pay

## 2021-12-23 ENCOUNTER — Ambulatory Visit (INDEPENDENT_AMBULATORY_CARE_PROVIDER_SITE_OTHER): Payer: Medicaid Other | Admitting: Primary Care

## 2022-01-07 ENCOUNTER — Ambulatory Visit: Payer: Medicaid Other | Admitting: Registered"

## 2022-01-08 ENCOUNTER — Emergency Department (HOSPITAL_COMMUNITY): Payer: Medicaid Other

## 2022-01-08 ENCOUNTER — Emergency Department (HOSPITAL_COMMUNITY)
Admission: EM | Admit: 2022-01-08 | Discharge: 2022-01-08 | Disposition: A | Discharge status: Home / Self Care | Payer: Medicaid Other | Attending: Emergency Medicine | Admitting: Emergency Medicine

## 2022-01-08 ENCOUNTER — Encounter (HOSPITAL_COMMUNITY): Payer: Self-pay

## 2022-01-08 ENCOUNTER — Other Ambulatory Visit: Payer: Self-pay

## 2022-01-08 DIAGNOSIS — S0990XA Unspecified injury of head, initial encounter: Secondary | ICD-10-CM | POA: Insufficient documentation

## 2022-01-08 DIAGNOSIS — Y92410 Unspecified street and highway as the place of occurrence of the external cause: Secondary | ICD-10-CM | POA: Insufficient documentation

## 2022-01-08 DIAGNOSIS — Z91040 Latex allergy status: Secondary | ICD-10-CM | POA: Diagnosis not present

## 2022-01-08 DIAGNOSIS — S161XXA Strain of muscle, fascia and tendon at neck level, initial encounter: Secondary | ICD-10-CM | POA: Diagnosis not present

## 2022-01-08 DIAGNOSIS — S0083XA Contusion of other part of head, initial encounter: Secondary | ICD-10-CM | POA: Diagnosis not present

## 2022-01-08 LAB — PREGNANCY, URINE: Preg Test, Ur: NEGATIVE

## 2022-01-08 MED ORDER — OXYCODONE-ACETAMINOPHEN 5-325 MG PO TABS
1.0000 | ORAL_TABLET | Freq: Once | ORAL | Status: AC
  Administered 2022-01-08: 1 via ORAL
  Filled 2022-01-08: qty 1

## 2022-01-08 NOTE — ED Notes (Addendum)
Discharge instructions reviewed with patient. Patient verbalized agreement and understanding of discharge teaching. Pt awake, alert, pt in NAD at time of discharge.

## 2022-01-08 NOTE — ED Triage Notes (Signed)
Pt reports headache and chest pain
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
## 2022-01-08 NOTE — ED Notes (Signed)
Pt ambulated to the bathroom without any difficulties. 
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
## 2022-11-25 ENCOUNTER — Ambulatory Visit
Admission: EM | Admit: 2022-11-25 | Discharge: 2022-11-25 | Disposition: A | Discharge status: Home / Self Care | Payer: Medicaid Other | Attending: Internal Medicine | Admitting: Internal Medicine

## 2022-11-25 DIAGNOSIS — R0602 Shortness of breath: Secondary | ICD-10-CM | POA: Insufficient documentation

## 2022-11-25 DIAGNOSIS — Z760 Encounter for issue of repeat prescription: Secondary | ICD-10-CM | POA: Diagnosis not present

## 2022-11-25 DIAGNOSIS — R6889 Other general symptoms and signs: Secondary | ICD-10-CM | POA: Insufficient documentation

## 2022-11-25 DIAGNOSIS — J069 Acute upper respiratory infection, unspecified: Secondary | ICD-10-CM | POA: Diagnosis not present

## 2022-11-25 DIAGNOSIS — R059 Cough, unspecified: Secondary | ICD-10-CM | POA: Diagnosis not present

## 2022-11-25 DIAGNOSIS — Z7951 Long term (current) use of inhaled steroids: Secondary | ICD-10-CM | POA: Diagnosis not present

## 2022-11-25 DIAGNOSIS — B349 Viral infection, unspecified: Secondary | ICD-10-CM | POA: Insufficient documentation

## 2022-11-25 DIAGNOSIS — J4521 Mild intermittent asthma with (acute) exacerbation: Secondary | ICD-10-CM | POA: Insufficient documentation

## 2022-11-25 DIAGNOSIS — E88819 Insulin resistance, unspecified: Secondary | ICD-10-CM | POA: Insufficient documentation

## 2022-11-25 DIAGNOSIS — Z1152 Encounter for screening for COVID-19: Secondary | ICD-10-CM | POA: Diagnosis not present

## 2022-11-25 MED ORDER — DEXAMETHASONE SODIUM PHOSPHATE 10 MG/ML IJ SOLN
10.0000 mg | Freq: Once | INTRAMUSCULAR | Status: AC
  Administered 2022-11-25: 10 mg via INTRAMUSCULAR

## 2022-11-25 MED ORDER — BENZONATATE 100 MG PO CAPS: 100.0000 mg | ORAL_CAPSULE | Freq: Three times a day (TID) | ORAL | 0 refills | Status: DC | PRN

## 2022-11-25 MED ORDER — ALBUTEROL SULFATE (2.5 MG/3ML) 0.083% IN NEBU: 2.5000 mg | INHALATION_SOLUTION | Freq: Four times a day (QID) | RESPIRATORY_TRACT | 12 refills | Status: AC | PRN

## 2022-11-25 NOTE — Discharge Instructions (Signed)
You have a viral upper respiratory infection.  COVID-19 testing is pending. We will call you with results if positive. I gave you a shot of steroid in the clinic to help with your breathing and calm down inflammation in your lungs.  Continue using her inhaler/nebulizer as needed every 4-6 hours for cough, shortness of breath, and wheezing.  Use the following medicines to help with symptoms: - Plain Mucinex (guaifenesin) over the counter as directed every 12 hours to thin mucous so that you are able to get it out of your body easier. Drink plenty of water while taking this medication so that it works well in your body (at least 8 cups a day).  - Tylenol 1,000mg  and/or ibuprofen 600mg  every 6 hours with food as needed for aches/pains or fever/chills.  - Tessalon perles every 8 hours as needed for cough.   1 tablespoon of honey in warm water and/or salt water gargles may also help with symptoms. Humidifier to your room will help add water to the air and reduce coughing.  If you develop any new or worsening symptoms, please return.  If your symptoms are severe, please go to the emergency room.  Follow-up with your primary care provider for further evaluation and management of your symptoms as well as ongoing wellness visits.  I hope you feel better!

## 2022-11-25 NOTE — ED Triage Notes (Signed)
Pt reports she has a cough, chest pain, sore throat feeling jittery, and spitting up mucus since this morning. Took theraflu, tylenol, and neb treatment but no relief.   Pt states she does have asthma. When she lays down she wheezes and can't breath hardly.

## 2022-11-25 NOTE — ED Provider Notes (Signed)
[facility]    CSN: [id number] Arrival date & time: 11/25/22  1839      History   Chief Complaint No chief complaint on file.   HPI Anna Sanders is a 22 y.o. female.   Patient presents to urgent care for evaluation of cough, congestion, and sore throat that started this morning. Cough is mostly dry and causes shortness of breath with coughing fits with associated chest rightness. History of asthma and using albuterol nebulizer at home and states she feels like the breathing treatments aren't really helping. No recent known sick contacts with similar symptoms. No N/V/D, abdominal pain, rash, fever/chills, recent antibiotic/steroid use, or decreased oral intake. Never smoker, denies drug use. Requesting refill of albuterol nebulizer solution.      Past Medical History:  Diagnosis Date   Asthma    Pre-diabetes    Seasonal allergies     Patient Active Problem List   Diagnosis Date Noted   Insulin resistance 08/06/2021   Morbid obesity with BMI of 40.0-44.9, adult (HCC) 05/22/2021   Medication monitoring encounter 01/29/2021   Hidradenitis suppurativa 10/16/2020   Cellulitis and abscess of leg 10/16/2020   Abscess of axilla, left 10/16/2020   Chronic neck and back pain 06/27/2020    Past Surgical History:  Procedure Laterality Date   COMBINED REDUCTION MAMMAPLASTY W/ ABDOMINOPLASTY Bilateral     OB History     Gravida  0   Para  0   Term  0   Preterm  0   AB  0   Living  0      SAB  0   IAB  0   Ectopic  0   Multiple  0   Live Births  0            Home Medications    Prior to Admission medications   Medication Sig Start Date End Date Taking? Authorizing Provider  albuterol (PROVENTIL) (2.5 MG/3ML) 0.083% nebulizer solution Take 3 mLs (2.5 mg total) by nebulization every 6 (six) hours as needed for wheezing or shortness of breath. 11/25/22  Yes Carlisle Beers, FNP  albuterol (VENTOLIN HFA) 108 (90 Base) MCG/ACT inhaler  Inhale into the lungs. 02/25/21 02/25/22  [provider]  benzonatate (TESSALON PERLES) 100 MG capsule Take 1 capsule (100 mg total) by mouth 3 (three) times daily as needed. 11/25/22   Carlisle Beers, FNP  doxycycline (VIBRAMYCIN) 100 MG capsule Take 100 mg by mouth 2 (two) times daily.    [provider]  etonogestrel (NEXPLANON) 68 MG IMPL implant 1 each by Subdermal route once.    [provider]  fexofenadine-pseudoephedrine (ALLEGRA-D 24) 180-240 MG 24 hr tablet Take 1 tablet by mouth daily.    [provider]  fluticasone (FLOVENT HFA) 44 MCG/ACT inhaler Inhale into the lungs 2 (two) times daily.    [provider]  fluticasone (FLOVENT HFA) 44 MCG/ACT inhaler Inhale into the lungs. 02/25/21 02/25/22  [provider]  ibuprofen (ADVIL,MOTRIN) 600 MG tablet Take 1 tablet (600 mg total) by mouth every 6 (six) hours as needed for mild pain or moderate pain. 09/13/17   Lowanda Foster, NP  montelukast (SINGULAIR) 10 MG tablet Take 10 mg by mouth at bedtime.    [provider]  montelukast (SINGULAIR) 10 MG tablet Take 1 tablet by mouth daily. 09/24/20   [provider]  Multiple Vitamin (MULTI-VITAMIN) tablet Take 1 tablet by mouth daily.    [provider]  Multiple  Vitamins-Minerals (MULTIVITAMIN WITH MINERALS) tablet Take 1 tablet by mouth daily.    [provider]  predniSONE (STERAPRED UNI-PAK 21 TAB) 10 MG (21) TBPK tablet Use as directed Patient not taking: Reported on 11/14/2021 06/12/21   Jannifer Rodney A, FNP  silver sulfADIAZINE (SILVADENE) 1 % cream Apply 1 application topically daily.    [provider]  spironolactone (ALDACTONE) 50 MG tablet Take 50 mg by mouth daily.    [provider]    Family History Family History  Problem Relation Age of Onset   Asthma Other    Hypertension Other    Diabetes Other    Hyperlipidemia Other     Social History Social History   Tobacco  Use   Smoking status: Never    Passive exposure: Yes   Smokeless tobacco: Never  Vaping Use   Vaping Use: Former  Substance Use Topics   Alcohol use: Not Currently    Comment: Occ.   Drug use: Never     Allergies   Apple, Apple juice, and Latex   Review of Systems Review of Systems Per HPI  Physical Exam Triage Vital Signs ED Triage Vitals  Enc Vitals Group     BP 11/25/22 1931 127/78     Pulse Rate 11/25/22 1931 97     Resp 11/25/22 1931 20     Temp 11/25/22 1928 98.2 F (36.8 C)     Temp Source 11/25/22 1931 Oral     SpO2 11/25/22 1931 98 %     Weight --      Height --      Head Circumference --      Peak Flow --      Pain Score 11/25/22 1931 7     Pain Loc --      Pain Edu? --      Excl. in GC? --    No data found.  Updated Vital Signs BP 127/78 (BP Location: Left Wrist)   Pulse 97   Temp 98.2 F (36.8 C) (Oral)   Resp 20   SpO2 98%   Visual Acuity Right Eye Distance:   Left Eye Distance:   Bilateral Distance:    Right Eye Near:   Left Eye Near:    Bilateral Near:     Physical Exam Vitals and nursing note reviewed.  Constitutional:      Appearance: She is ill-appearing. She is not toxic-appearing.  HENT:     Head: Normocephalic and atraumatic.     Right Ear: Hearing, tympanic membrane, ear canal and external ear normal.     Left Ear: Hearing, tympanic membrane, ear canal and external ear normal.     Nose: Congestion present.     Mouth/Throat:     Lips: Pink.     Mouth: Mucous membranes are moist. No injury.     Tongue: No lesions. Tongue does not deviate from midline.     Palate: No mass and lesions.     Pharynx: Oropharynx is clear. Uvula midline. Posterior oropharyngeal erythema present. No pharyngeal swelling, oropharyngeal exudate or uvula swelling.     Tonsils: No tonsillar exudate or tonsillar abscesses.  Eyes:     General: Lids are normal. Vision grossly intact. Gaze aligned appropriately.     Extraocular Movements: Extraocular  movements intact.     Conjunctiva/sclera: Conjunctivae normal.  Cardiovascular:     Rate and Rhythm: Normal rate and regular rhythm.     Heart sounds: Normal heart sounds, S1 normal and S2 normal.  Pulmonary:     Effort: Pulmonary effort is normal. No respiratory distress.     Breath sounds: Normal air entry. No stridor. No wheezing, rhonchi or rales.     Comments: Course breath sounds throughout. No wheezing, rhonchi, or rales. Speaking in full sentences without difficulty.  Chest:     Chest wall: No tenderness.  Musculoskeletal:     Cervical back: Neck supple.     Right lower leg: No edema.     Left lower leg: No edema.  Lymphadenopathy:     Cervical: No cervical adenopathy.  Skin:    General: Skin is warm and dry.     Capillary Refill: Capillary refill takes less than 2 seconds.     Findings: No rash.  Neurological:     General: No focal deficit present.     Mental Status: She is alert and oriented to person, place, and time. Mental status is at baseline.     Cranial Nerves: No dysarthria or facial asymmetry.  Psychiatric:        Mood and Affect: Mood normal.        Speech: Speech normal.        Behavior: Behavior normal.        Thought Content: Thought content normal.        Judgment: Judgment normal.      UC Treatments / Results  Labs (all labs ordered are listed, but only abnormal results are displayed) Labs Reviewed  SARS CORONAVIRUS 2 (TAT 6-24 HRS)    EKG   Radiology No results found.  Procedures Procedures (including critical care time)  Medications Ordered in UC Medications  dexamethasone (DECADRON) injection 10 mg (10 mg Intramuscular Given 11/25/22 1950)    Initial Impression / Assessment and Plan / UC Course  I have reviewed the triage vital signs and the nursing notes.  Pertinent labs & imaging results that were available during my care of the patient were reviewed by me and considered in my medical decision making (see chart for details).    1. Viral URI with cough, mild intermittent asthma with acute exacerbation, shortness of breath Presentation consistent with mild intermittent asthma exacerbation likely triggered by viral URI. COVID testing is pending, will call if positive. She is a candidate for antiviral therapy and is currently on day 1 of symptoms. Using albuterol nebulizer at home, may continue this every 4-6 hours as needed for cough, shortness of breath, and wheeze. Dexamethasone 10mg  IM given in clinic to reduce inflammation to lungs contributing to cough and wheeze. Will defer use of prednisone burst due to history of insulin resistance. Dex injection will continue to work in body over the next 2-3 days. Tessalon perles as needed for cough. Mucinex encouraged for cough as well. No indication for imaging of the chest today based on stable cardiopulmonary exam and stable vital signs.   Discussed physical exam and available lab work findings in clinic with patient.  Counseled patient regarding appropriate use of medications and potential side effects for all medications recommended or prescribed today. Discussed red flag signs and symptoms of worsening condition,when to call the PCP office, return to urgent care, and when to seek higher level of care in the emergency department. Patient verbalizes understanding and agreement with plan. All questions answered. Patient discharged in stable condition.    Final Clinical Impressions(s) / UC Diagnoses   Final diagnoses:  Viral URI with cough  Mild intermittent asthma with (acute) exacerbation  Shortness of breath  Discharge Instructions      You have a viral upper respiratory infection.  COVID-19 testing is pending. We will call you with results if positive. I gave you a shot of steroid in the clinic to help with your breathing and calm down inflammation in your lungs.  Continue using her inhaler/nebulizer as needed every 4-6 hours for cough, shortness of breath, and  wheezing.  Use the following medicines to help with symptoms: - Plain Mucinex (guaifenesin) over the counter as directed every 12 hours to thin mucous so that you are able to get it out of your body easier. Drink plenty of water while taking this medication so that it works well in your body (at least 8 cups a day).  - Tylenol 1,000mg  and/or ibuprofen 600mg  every 6 hours with food as needed for aches/pains or fever/chills.  - Tessalon perles every 8 hours as needed for cough.   1 tablespoon of honey in warm water and/or salt water gargles may also help with symptoms. Humidifier to your room will help add water to the air and reduce coughing.  If you develop any new or worsening symptoms, please return.  If your symptoms are severe, please go to the emergency room.  Follow-up with your primary care provider for further evaluation and management of your symptoms as well as ongoing wellness visits.  I hope you feel better!      ED Prescriptions     Medication Sig Dispense Auth. Provider   benzonatate (TESSALON PERLES) 100 MG capsule Take 1 capsule (100 mg total) by mouth 3 (three) times daily as needed. 20 capsule Reita May M, FNP   albuterol (PROVENTIL) (2.5 MG/3ML) 0.083% nebulizer solution Take 3 mLs (2.5 mg total) by nebulization every 6 (six) hours as needed for wheezing or shortness of breath. 75 mL Carlisle Beers, FNP      PDMP not reviewed this encounter.   Carlisle Beers, [place] 11/30/22 [phone]

## 2022-11-26 LAB — SARS CORONAVIRUS 2 (TAT 6-24 HRS): SARS Coronavirus 2: NEGATIVE

## 2022-12-15 ENCOUNTER — Emergency Department (HOSPITAL_COMMUNITY)
Admission: EM | Admit: 2022-12-15 | Discharge: 2022-12-15 | Disposition: A | Discharge status: Home / Self Care | Payer: Medicaid Other | Attending: Emergency Medicine | Admitting: Emergency Medicine

## 2022-12-15 ENCOUNTER — Other Ambulatory Visit: Payer: Self-pay

## 2022-12-15 ENCOUNTER — Encounter (HOSPITAL_COMMUNITY): Payer: Self-pay

## 2022-12-15 DIAGNOSIS — Z91040 Latex allergy status: Secondary | ICD-10-CM | POA: Insufficient documentation

## 2022-12-15 DIAGNOSIS — Z3046 Encounter for surveillance of implantable subdermal contraceptive: Secondary | ICD-10-CM | POA: Diagnosis present

## 2022-12-15 NOTE — Discharge Instructions (Signed)
Please follow-up with an OBGYN for removal. There are offices on these papers for you to call.

## 2022-12-15 NOTE — ED Triage Notes (Signed)
Reports needing nexplanon out and has gone to UC and was told to follow up with PCP or OB

## 2022-12-15 NOTE — ED Provider Notes (Signed)
[place] EMERGENCY DEPARTMENT AT [facility] Provider Note   CSN: [id number] Arrival date & time: 12/15/22  1133     History  Chief Complaint  Patient presents with   birth control removal    Anna Sanders is a 22 y.o. female presenting today requesting her Nexplanon removal.  She was supposed to have it removed a month ago but was unable to follow-up.  Endorsing frequent urinary periods that are heavier than normal.  PCP tried to set her up with OB/GYN but there was a problem with the referral.  HPI     Home Medications Prior to Admission medications   Medication Sig Start Date End Date Taking? Authorizing Provider  albuterol (PROVENTIL) (2.5 MG/3ML) 0.083% nebulizer solution Take 3 mLs (2.5 mg total) by nebulization every 6 (six) hours as needed for wheezing or shortness of breath. 11/25/22   Carlisle Beers, FNP  albuterol (VENTOLIN HFA) 108 (90 Base) MCG/ACT inhaler Inhale into the lungs. 02/25/21 02/25/22  [provider]  benzonatate (TESSALON PERLES) 100 MG capsule Take 1 capsule (100 mg total) by mouth 3 (three) times daily as needed. 11/25/22   Carlisle Beers, FNP  doxycycline (VIBRAMYCIN) 100 MG capsule Take 100 mg by mouth 2 (two) times daily.    [provider]  etonogestrel (NEXPLANON) 68 MG IMPL implant 1 each by Subdermal route once.    [provider]  fexofenadine-pseudoephedrine (ALLEGRA-D 24) 180-240 MG 24 hr tablet Take 1 tablet by mouth daily.    [provider]  fluticasone (FLOVENT HFA) 44 MCG/ACT inhaler Inhale into the lungs 2 (two) times daily.    [provider]  fluticasone (FLOVENT HFA) 44 MCG/ACT inhaler Inhale into the lungs. 02/25/21 02/25/22  [provider]  ibuprofen (ADVIL,MOTRIN) 600 MG tablet Take 1 tablet (600 mg total) by mouth every 6 (six) hours as needed for mild pain or moderate pain. 09/13/17   Lowanda Foster, NP  montelukast (SINGULAIR) 10 MG tablet Take 10 mg by mouth at  bedtime.    [provider]  montelukast (SINGULAIR) 10 MG tablet Take 1 tablet by mouth daily. 09/24/20   [provider]  Multiple Vitamin (MULTI-VITAMIN) tablet Take 1 tablet by mouth daily.    [provider]  Multiple Vitamins-Minerals (MULTIVITAMIN WITH MINERALS) tablet Take 1 tablet by mouth daily.    [provider]  predniSONE (STERAPRED UNI-PAK 21 TAB) 10 MG (21) TBPK tablet Use as directed Patient not taking: Reported on 11/14/2021 06/12/21   Jannifer Rodney A, FNP  silver sulfADIAZINE (SILVADENE) 1 % cream Apply 1 application topically daily.    [provider]  spironolactone (ALDACTONE) 50 MG tablet Take 50 mg by mouth daily.    [provider]      Allergies    Apple, Apple juice, and Latex    Review of Systems   Review of Systems  Physical Exam Updated Vital Signs BP (!) 99/54 (BP Location: Right Arm)   Pulse 66   Temp 98.9 F (37.2 C)   Resp 15   Ht 4\' 10"  (1.473 m)   Wt 86.2 kg   SpO2 100%   BMI 39.71 kg/m  Physical Exam Vitals and nursing note reviewed.  Constitutional:      Appearance: Normal appearance.  HENT:     Head: Normocephalic and atraumatic.  Eyes:     General: No scleral icterus.    Conjunctiva/sclera: Conjunctivae normal.  Pulmonary:     Effort: Pulmonary effort is normal.  No respiratory distress.  Skin:    Findings: No rash.  Neurological:     Mental Status: She is alert.  Psychiatric:        Mood and Affect: Mood normal.     ED Results / Procedures / Treatments   Labs (all labs ordered are listed, but only abnormal results are displayed) Labs Reviewed - No data to display  EKG None  Radiology No results found.  Procedures Procedures   Medications Ordered in ED Medications - No data to display  ED Course/ Medical Decision Making/ A&P                             Medical Decision Making  Healthy 22 year old female presenting today requesting her Nexplanon removal.  I  called the [facility] to see if they would do this and they said it needs to be done outpatient.  We obviously do not do this procedure via the emergency department.  I pulled patient into a triage room and notified her of this.  She has no emergent symptoms and is not septic.  Does not require emergent procedure.  She was given referrals to OB/GYN and discharged from triage.  Final Clinical Impression(s) / ED Diagnoses Final diagnoses:  Encounter for Nexplanon removal    Rx / DC Orders ED Discharge Orders     None      Results and diagnoses were explained to the patient. Return precautions discussed in full. Patient had no additional questions and expressed complete understanding.   This chart was dictated using voice recognition software.  Despite best efforts to proofread,  errors can occur which can change the documentation meaning.    Woodroe Chen 12/15/22 1202    Linwood Dibbles, MD 12/16/22 [phone]

## 2023-02-19 ENCOUNTER — Encounter: Payer: Self-pay | Admitting: Obstetrics and Gynecology

## 2023-02-19 ENCOUNTER — Other Ambulatory Visit (HOSPITAL_COMMUNITY)
Admission: RE | Admit: 2023-02-19 | Discharge: 2023-02-19 | Disposition: A | Discharge status: Home / Self Care | Payer: Medicaid Other | Source: Ambulatory Visit | Attending: Obstetrics and Gynecology | Admitting: Obstetrics and Gynecology

## 2023-02-19 ENCOUNTER — Ambulatory Visit (INDEPENDENT_AMBULATORY_CARE_PROVIDER_SITE_OTHER): Payer: Medicaid Other | Admitting: Obstetrics and Gynecology

## 2023-02-19 VITALS — BP 119/79 | HR 98 | Wt 176.8 lb

## 2023-02-19 DIAGNOSIS — B9689 Other specified bacterial agents as the cause of diseases classified elsewhere: Secondary | ICD-10-CM

## 2023-02-19 DIAGNOSIS — Z113 Encounter for screening for infections with a predominantly sexual mode of transmission: Secondary | ICD-10-CM | POA: Insufficient documentation

## 2023-02-19 DIAGNOSIS — Z3046 Encounter for surveillance of implantable subdermal contraceptive: Secondary | ICD-10-CM | POA: Diagnosis not present

## 2023-02-19 NOTE — Progress Notes (Signed)
Pt presents for Nexplanon removal and reinsertion. Nexplanon placed 10/2019.  No PAP Hx, requesting STD testing

## 2023-02-20 MED ORDER — ETONOGESTREL 68 MG ~~LOC~~ IMPL
68.0000 mg | DRUG_IMPLANT | Freq: Once | SUBCUTANEOUS | Status: AC
Start: 2023-02-20 — End: 2023-02-19
  Administered 2023-02-19: 68 mg via SUBCUTANEOUS

## 2023-02-23 ENCOUNTER — Other Ambulatory Visit: Payer: Self-pay

## 2023-02-23 ENCOUNTER — Encounter: Payer: Self-pay | Admitting: Obstetrics and Gynecology

## 2023-02-23 DIAGNOSIS — B9689 Other specified bacterial agents as the cause of diseases classified elsewhere: Secondary | ICD-10-CM

## 2023-02-23 MED ORDER — METRONIDAZOLE 500 MG PO TABS
500.0000 mg | ORAL_TABLET | Freq: Two times a day (BID) | ORAL | 0 refills | Status: DC
Start: 2023-02-23 — End: 2023-05-05

## 2023-02-23 NOTE — Progress Notes (Signed)
GYNECOLOGY OFFICE PROCEDURE NOTE    Ms. Anna Sanders is a 22 y.o. G0P0000 here for Nexplanon insertion. No complaints. UPT ordered today.  BP 119/79   Pulse 98   Wt 176 lb 12.8 oz (80.2 kg)   BMI 36.95 kg/m    No results found for this or any previous visit (from the past 24 hour(s)).    Nexplanon Insertion Procedure Patient identified, informed consent performed, consent signed. Patient does understand that irregular bleeding is a very common side effect of this medication. She was advised to have backup contraception for one week after placement. Pregnancy test in clinic today was negative.  Appropriate time out taken.  Patient's left arm was prepped and draped in the usual sterile fashion. The ruler used to measure and mark insertion area.  Patient was prepped with alcohol swab and then injected with 3 ml of 1% lidocaine.  She was prepped with betadine, Nexplanon removed from packaging,  Device confirmed in needle, then inserted full length of needle and withdrawn per handbook instructions. Nexplanon was able to palpated in the patient's arm; patient palpated the insert herself. There was minimal blood loss. 3.0 vicryl suture used for subcuticular closure of incision to prevent expulsion of Nexplanon from incision site. Patient insertion site covered with steristrips, large bandaid,  and a pressure bandage to reduce any bruising.  The patient tolerated the procedure well and was given post procedure instructions. Follow-up via My Chart video visit, unless having problems and need to be seen in the office.  Nexplanon Lot#: [id number] / Expiration Date: 10/2024   Raelyn Mora, CNM  02/19/2023

## 2023-02-23 NOTE — Progress Notes (Signed)
GYNECOLOGY OFFICE PROCEDURE NOTE  Ms. Anna Sanders is a G0P0000 female in the office today for removal of Nexplanon; which was inserted 11/17/2019 at [facility]. She desires to have it replaced today. She has no complaints today.  BP 119/79   Pulse 98   Wt 176 lb 12.8 oz (80.2 kg)   BMI 36.95 kg/m    Procedure Note: Consent obtained and Time-Out conducted Implant palpated in left upper arm Betadine prep done on area of excision/removal Lidocaine infiltrated into intradermal and subcutaneous space Small 2 mm incision made with scalpel Incision extended ~ 0.5 mm and subcutaneous tissue extracted from incision to better visualize implant. Pressure applied to distal end of implant which exposed the tip through incision Tip of implant grasped with hemostat There was significant adherence of implant in subcutaneous tissue.  Twisting and manipulation freed the implant from the capsule Implant removed Pressure held on incision until bleeding stopped See insertion note    Time spent with patient doing procedure 30 minutes.   Raelyn Mora, CNM  02/19/2023 1:24 PM

## 2023-03-07 ENCOUNTER — Other Ambulatory Visit: Payer: Self-pay

## 2023-03-07 ENCOUNTER — Emergency Department (HOSPITAL_COMMUNITY)
Admission: EM | Admit: 2023-03-07 | Discharge: 2023-03-07 | Disposition: A | Discharge status: Home / Self Care | Payer: Medicaid Other | Attending: Emergency Medicine | Admitting: Emergency Medicine

## 2023-03-07 ENCOUNTER — Encounter (HOSPITAL_COMMUNITY): Payer: Self-pay | Admitting: Emergency Medicine

## 2023-03-07 DIAGNOSIS — Y92410 Unspecified street and highway as the place of occurrence of the external cause: Secondary | ICD-10-CM | POA: Insufficient documentation

## 2023-03-07 DIAGNOSIS — M546 Pain in thoracic spine: Secondary | ICD-10-CM | POA: Diagnosis present

## 2023-03-07 DIAGNOSIS — Z91040 Latex allergy status: Secondary | ICD-10-CM | POA: Insufficient documentation

## 2023-03-07 MED ORDER — KETOROLAC TROMETHAMINE 15 MG/ML IJ SOLN
15.0000 mg | Freq: Once | INTRAMUSCULAR | Status: AC
  Administered 2023-03-07: 15 mg via INTRAMUSCULAR
  Filled 2023-03-07: qty 1
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
## 2023-03-07 NOTE — Discharge Instructions (Signed)
Return for any problem.  ?

## 2023-03-31 ENCOUNTER — Ambulatory Visit: Payer: Medicaid Other | Admitting: Advanced Practice Midwife

## 2023-03-31 NOTE — Progress Notes (Deleted)
   Subjective:     Anna Sanders is a 22 y.o. female here at [facility] *** for a routine exam.  Current complaints: ***.  Personal health questionnaire reviewed: {yes/no:9010}.  Do you have a primary care provider? *** Do you feel safe at home? ***  Flowsheet Row Nutrition from 05/29/2021 in [facility] Total Score 0       Health Maintenance Due  Topic Date Due   HPV VACCINES (1 - 3-dose series) Never done   DTaP/Tdap/Td (1 - Tdap) Never done   PAP-Cervical Cytology Screening  Never done   PAP SMEAR-Modifier  Never done   INFLUENZA VACCINE  02/19/2023   COVID-19 Vaccine (1 - 2023-24 season) Never done     Risk factors for chronic health problems: Smoking: Alchohol/how much: Pt BMI: There is no height or weight on file to calculate BMI.   Gynecologic History No LMP recorded. Patient has had an implant. Contraception: {method:5051} Last Pap: ***. Results were: {norm/abn:16337} Last mammogram: ***. Results were: {norm/abn:16337}  Obstetric History OB History  Gravida Para Term Preterm AB Living  0 0 0 0 0 0  SAB IAB Ectopic Multiple Live Births  0 0 0 0 0     {Common ambulatory SmartLinks:19316}  Review of Systems {ros; complete:30496}    Objective:  There were no vitals taken for this visit.  VS reviewed, nursing note reviewed,  Constitutional: well developed, well nourished, no distress HEENT: normocephalic, thyroid without enlargement or mass HEART: RRR, no murmurs rubs/gallops RESP: clear and equal to auscultation bilaterally in all lobes  Breast Exam:  ***Deferred with low risks and shared decision making, discussed recommendation to start mammogram between 40-50 yo/ exam performed: right breast normal without mass, skin or nipple changes or axillary nodes, left breast normal without mass, skin or nipple changes or axillary nodes Abdomen: soft Neuro: alert and oriented x 3 Skin: warm, dry Psych:  affect normal Pelvic exam: ***Deferred/ Performed: Cervix pink, visually closed, without lesion, scant white creamy discharge, vaginal walls and external genitalia normal Bimanual exam: Cervix 0/long/high, firm, anterior, neg CMT, uterus nontender, nonenlarged, adnexa without tenderness, enlargement, or mass        Assessment/Plan:   There are no diagnoses linked to this encounter.     No follow-ups on file.   Sharen Counter, CNM 8:27 AM

## 2023-05-05 ENCOUNTER — Other Ambulatory Visit (HOSPITAL_COMMUNITY)
Admission: RE | Admit: 2023-05-05 | Discharge: 2023-05-05 | Disposition: A | Discharge status: Home / Self Care | Payer: Medicaid Other | Source: Ambulatory Visit | Attending: Advanced Practice Midwife | Admitting: Advanced Practice Midwife

## 2023-05-05 ENCOUNTER — Ambulatory Visit: Payer: Medicaid Other | Admitting: Obstetrics and Gynecology

## 2023-05-05 ENCOUNTER — Encounter: Payer: Self-pay | Admitting: Obstetrics and Gynecology

## 2023-05-05 VITALS — BP 111/73 | HR 79 | Ht <= 58 in | Wt 177.3 lb

## 2023-05-05 DIAGNOSIS — Z01419 Encounter for gynecological examination (general) (routine) without abnormal findings: Secondary | ICD-10-CM | POA: Diagnosis not present

## 2023-05-05 DIAGNOSIS — Z113 Encounter for screening for infections with a predominantly sexual mode of transmission: Secondary | ICD-10-CM | POA: Diagnosis present

## 2023-05-05 DIAGNOSIS — Z975 Presence of (intrauterine) contraceptive device: Secondary | ICD-10-CM | POA: Diagnosis not present

## 2023-05-05 NOTE — Progress Notes (Signed)
ANNUAL EXAM Patient name: Anna Sanders MRN [id number]  Date of birth: [birth year] Chief Complaint:   Annual Exam  History of Present Illness:   Anna Sanders is a 22 y.o. G0P0000 African-American female being seen today for a routine annual exam.   Current complaints: None. Pt has Nexplanon in place. No periods. Currently sexually active with a female partner. No symptoms of STI or exposures that she knows of. In college for SW.  No LMP recorded (lmp unknown). Patient has had an implant.      05/29/2021    8:37 AM 11/17/2019   11:30 AM  Depression screen PHQ 2/9  Decreased Interest 0 0  Down, Depressed, Hopeless 0 0  PHQ - 2 Score 0 0  Altered sleeping  2  Tired, decreased energy  0  Change in appetite  0  Feeling bad or failure about yourself   1  Trouble concentrating  2  Moving slowly or fidgety/restless  0  Suicidal thoughts  0  PHQ-9 Score  5        11/17/2019   11:29 AM  GAD 7 : Generalized Anxiety Score  Nervous, Anxious, on Edge 0  Control/stop worrying 2  Worry too much - different things 3  Trouble relaxing 0  Restless 0  Easily annoyed or irritable 1  Afraid - awful might happen 0  Total GAD 7 Score 6     Review of Systems:   Pertinent items are noted in HPI Denies any headaches, blurred vision, fatigue, shortness of breath, chest pain, abdominal pain, abnormal vaginal discharge/itching/odor/irritation, problems with periods, bowel movements, urination, or intercourse unless otherwise stated above. Pertinent History Reviewed:  Reviewed past medical,surgical, social and family history.  Reviewed problem list, medications and allergies. Physical Assessment:   Vitals:   05/05/23 1526  BP: 111/73  Pulse: 79  Weight: 177 lb 4.8 oz (80.4 kg)  Height: 4\' 8"  (1.422 m)  Body mass index is 39.75 kg/m.        Physical Examination:   General appearance - well appearing, and in no distress  Mental status - alert, oriented to person, place, and  time  Psych:  She has a normal mood and affect  Skin - warm and dry, normal color, no suspicious lesions noted  Chest - effort normal, all lung fields clear to auscultation bilaterally  Heart - normal rate and regular rhythm  Neck:  midline trachea, no thyromegaly or nodules  Breasts - deferred. No breast concerns  Abdomen - soft, nontender, nondistended, no masses or organomegaly  Pelvic - VULVA: normal appearing vulva with no masses, tenderness or lesions  VAGINA: normal appearing vagina with normal color and discharge, no lesions  CERVIX: normal appearing cervix without discharge or lesions, no CMT  Thin prep pap is done with reflex HR HPV cotesting  UTERUS: uterus is felt to be normal size, shape, consistency and nontender   ADNEXA: No adnexal masses or tenderness noted.  Extremities:  No swelling or varicosities noted  Chaperone present for exam  No results found for this or any previous visit (from the past 24 hour(s)).  Assessment & Plan:  1) Well-Woman Exam: Patient amenable to first pap smear today. Discussed ASCCP guidelines for pap smear every 3 years until age of 64 unless otherwise indicated. Also amenable to routine STI screening. Has PCP  2) Nexplanon in place: Does not desire pregnancy and likes the amenorrhea associated with the nexplanon. Plan for replacement 8/27 unless something changes otherwise  Labs/procedures today: pap, labs  Orders Placed This Encounter  Procedures   HIV Antibody (routine testing w rflx)   HCV Ab w Reflex to Quant PCR   RPR   Hepatitis B surface antigen   Hepatitis C Antibody    Meds: No orders of the defined types were placed in this encounter.   Follow-up: Return in about 1 year (around 05/04/2024) for Annual.  Joanne Gavel, MD 05/05/2023 4:00 PM

## 2023-05-06 LAB — CERVICOVAGINAL ANCILLARY ONLY
Bacterial Vaginitis (gardnerella): NEGATIVE
Candida Glabrata: NEGATIVE
Candida Vaginitis: NEGATIVE
Chlamydia: NEGATIVE
Comment: NEGATIVE
Comment: NEGATIVE
Comment: NEGATIVE
Comment: NEGATIVE
Comment: NEGATIVE
Comment: NORMAL
Neisseria Gonorrhea: NEGATIVE
Trichomonas: NEGATIVE

## 2023-05-06 LAB — HEPATITIS B SURFACE ANTIGEN: Hepatitis B Surface Ag: NEGATIVE

## 2023-05-06 LAB — HIV ANTIBODY (ROUTINE TESTING W REFLEX): HIV Screen 4th Generation wRfx: NONREACTIVE

## 2023-05-06 LAB — SYPHILIS: RPR W/REFLEX TO RPR TITER AND TREPONEMAL ANTIBODIES, TRADITIONAL SCREENING AND DIAGNOSIS ALGORITHM: RPR Ser Ql: NONREACTIVE

## 2023-05-06 LAB — HEPATITIS C ANTIBODY: Hep C Virus Ab: NONREACTIVE

## 2023-05-06 LAB — CYTOLOGY - PAP
Adequacy: ABSENT
Diagnosis: NEGATIVE

## 2023-07-13 IMAGING — DX DG ELBOW COMPLETE 3+V*L*
4 series · 4 of 4 positions shown · non-contrast
Comparison: None.

CLINICAL DATA: Left elbow pain after injury yesterday.

EXAM:
LEFT ELBOW - COMPLETE 3+ VIEW

[elbow ap (1 of 3)]
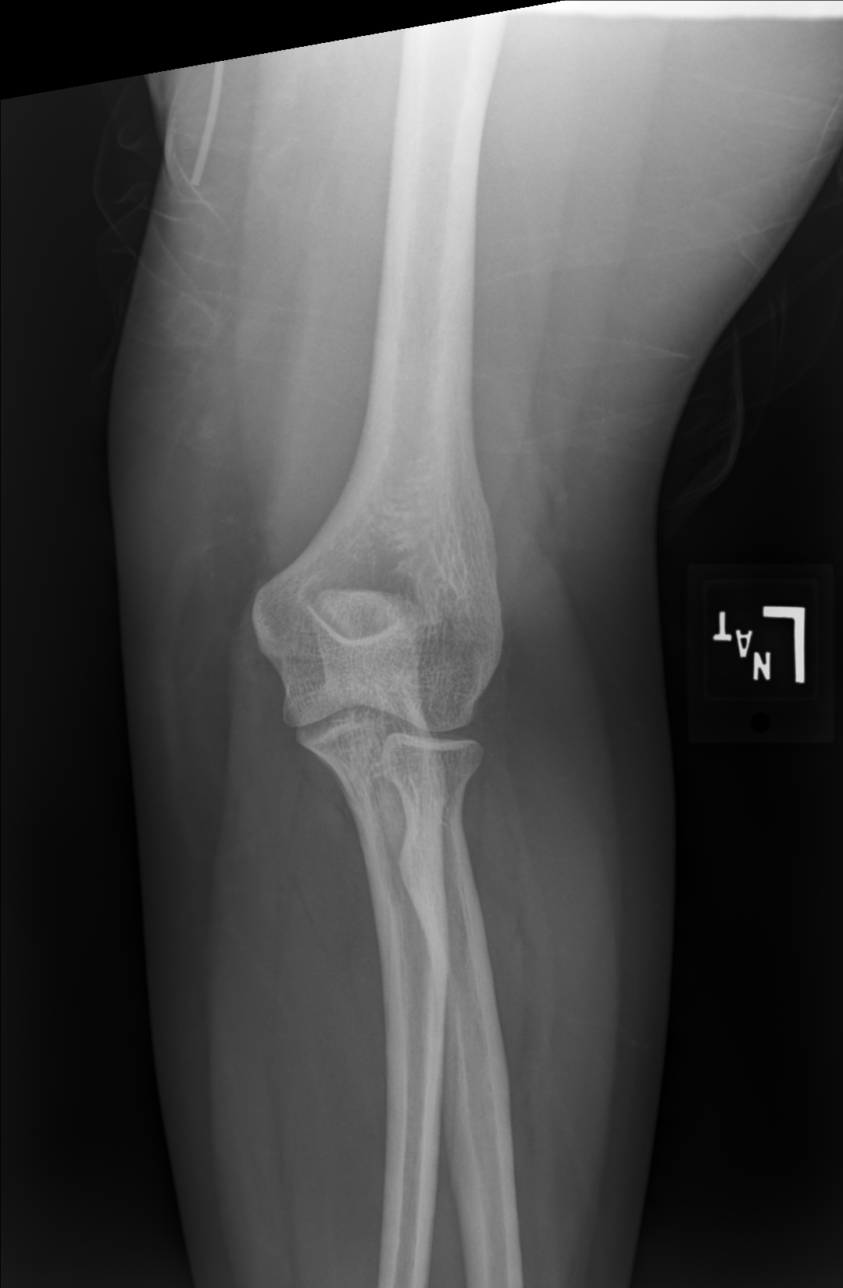

[elbow ap (2 of 3)]
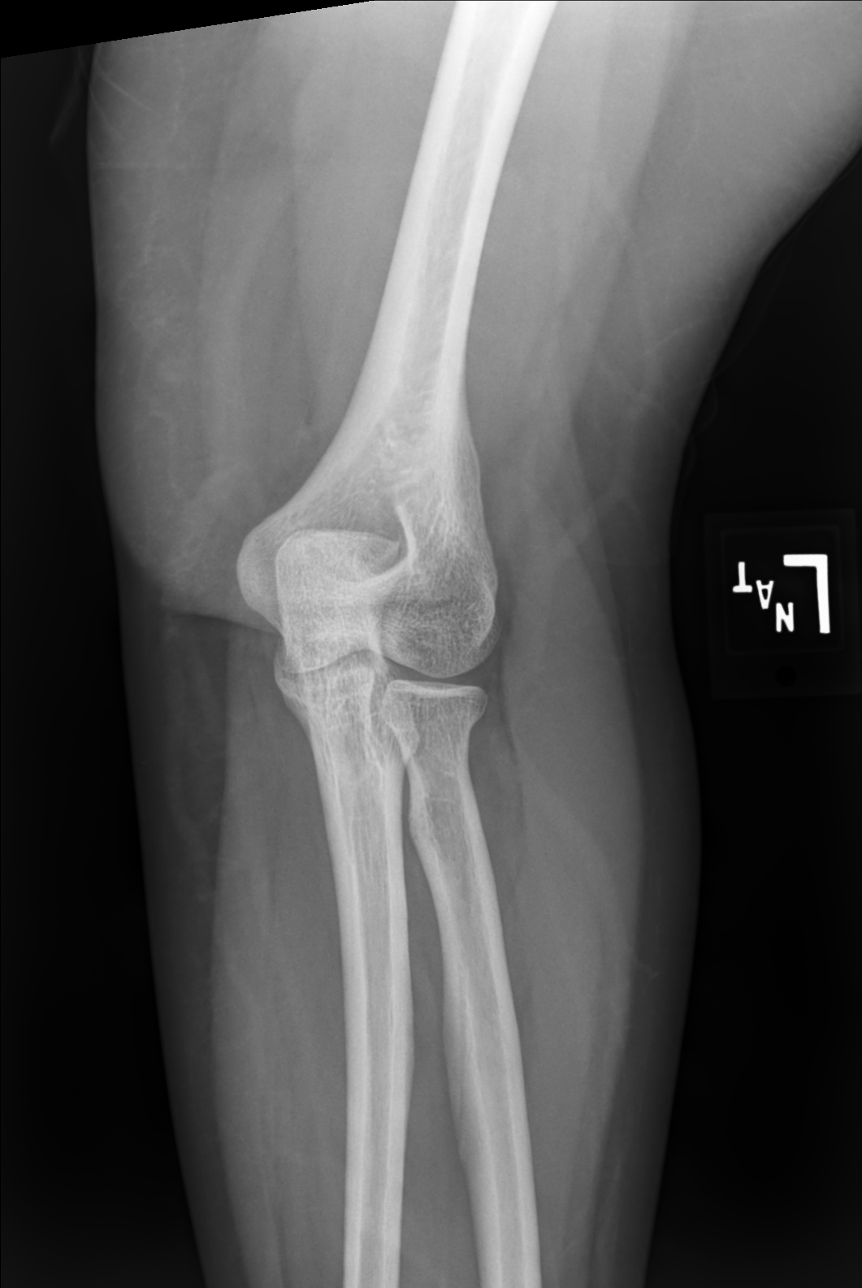

[elbow ap (3 of 3)]
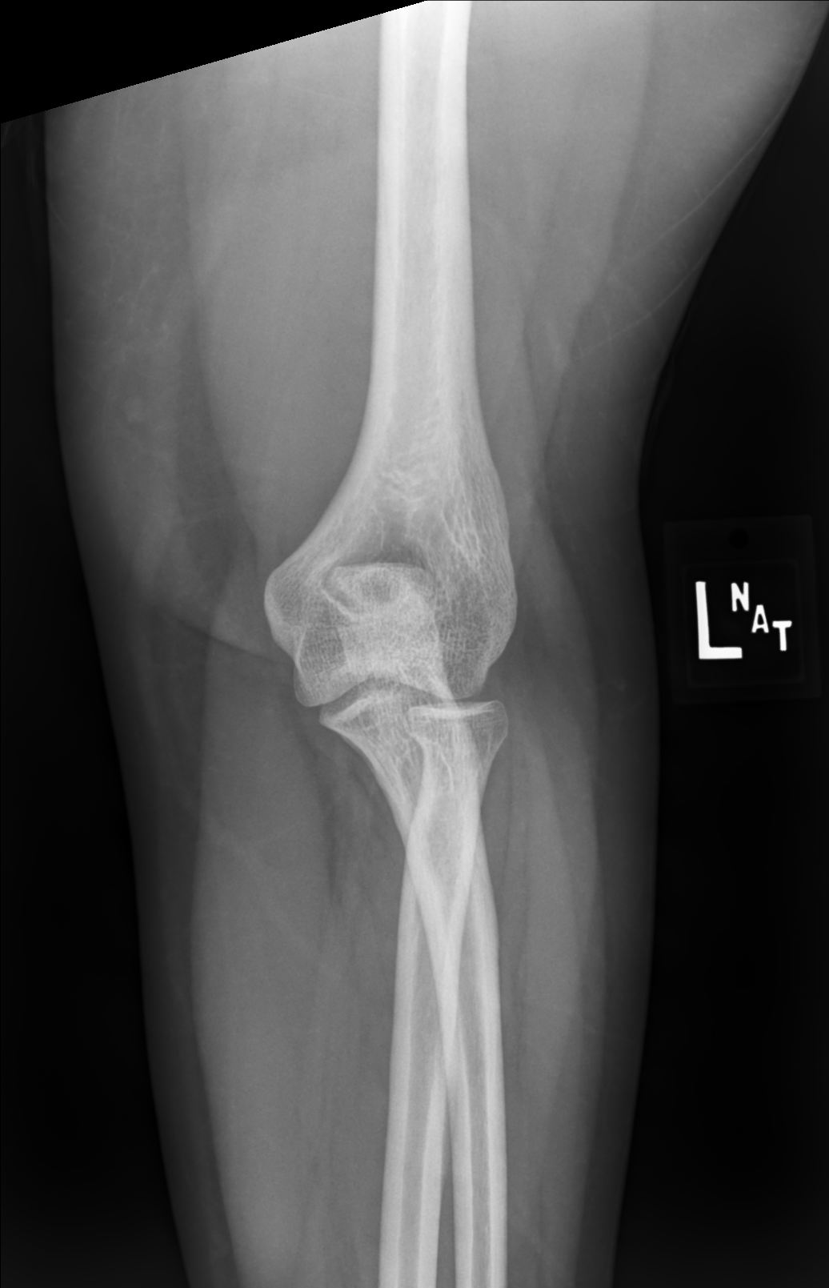

[elbow lat]
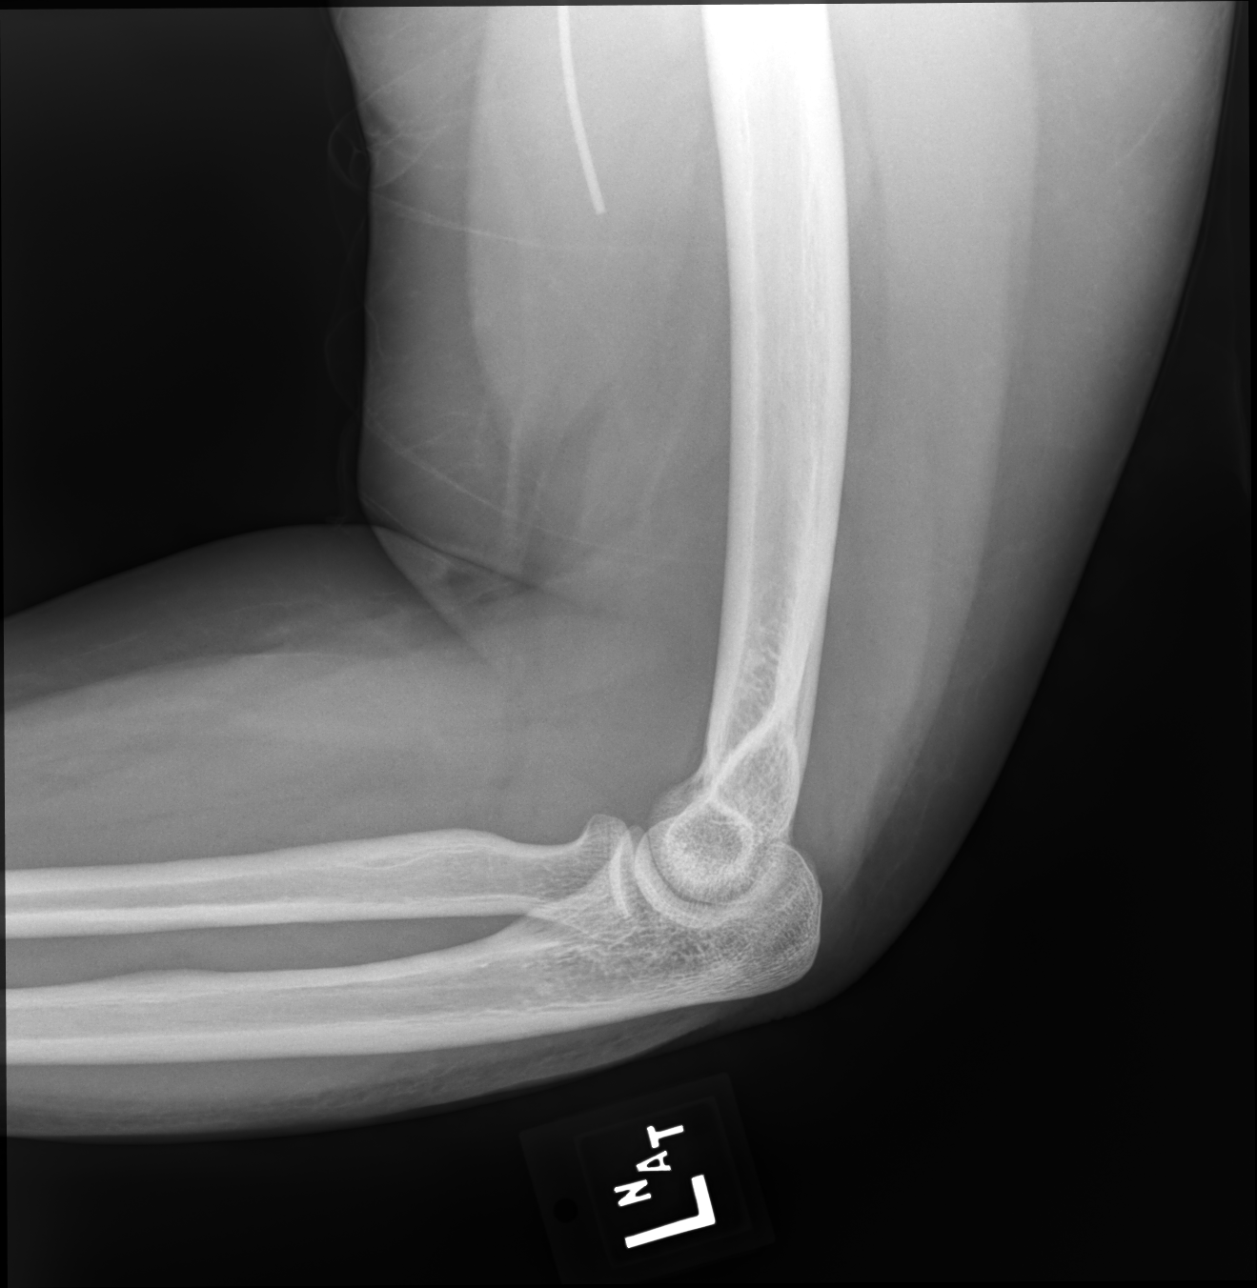

[4 of 4 positions shown; findings below may reference images not displayed]

FINDINGS: There is no evidence of fracture, dislocation, or joint effusion.
There is no evidence of arthropathy or other focal bone abnormality.
Soft tissues are unremarkable.
IMPRESSION: Negative.

## 2023-07-13 IMAGING — DX DG SHOULDER 2+V*L*
3 series · 3 of 3 positions shown · non-contrast
Comparison: None.

CLINICAL DATA: Left arm pain after injury yesterday.

EXAM:
LEFT SHOULDER - 2+ VIEW

[shoulder neutral ap]
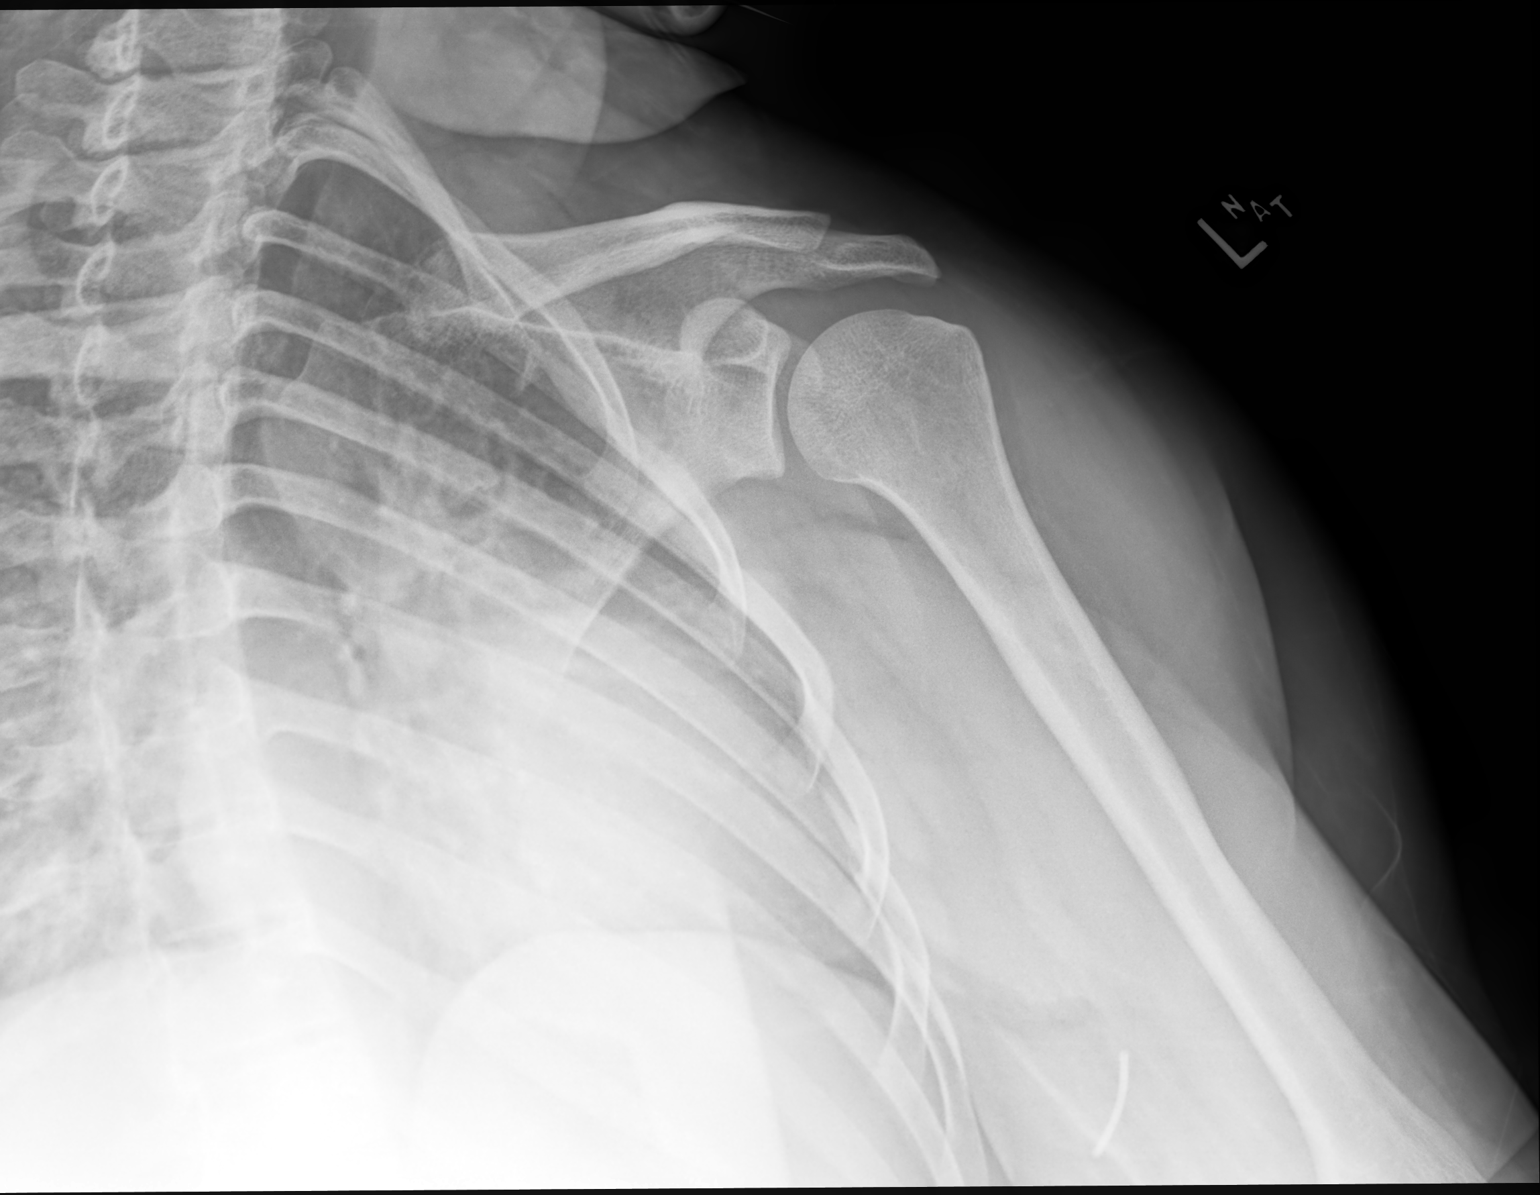

[shoulder transscapular y view (neer)]
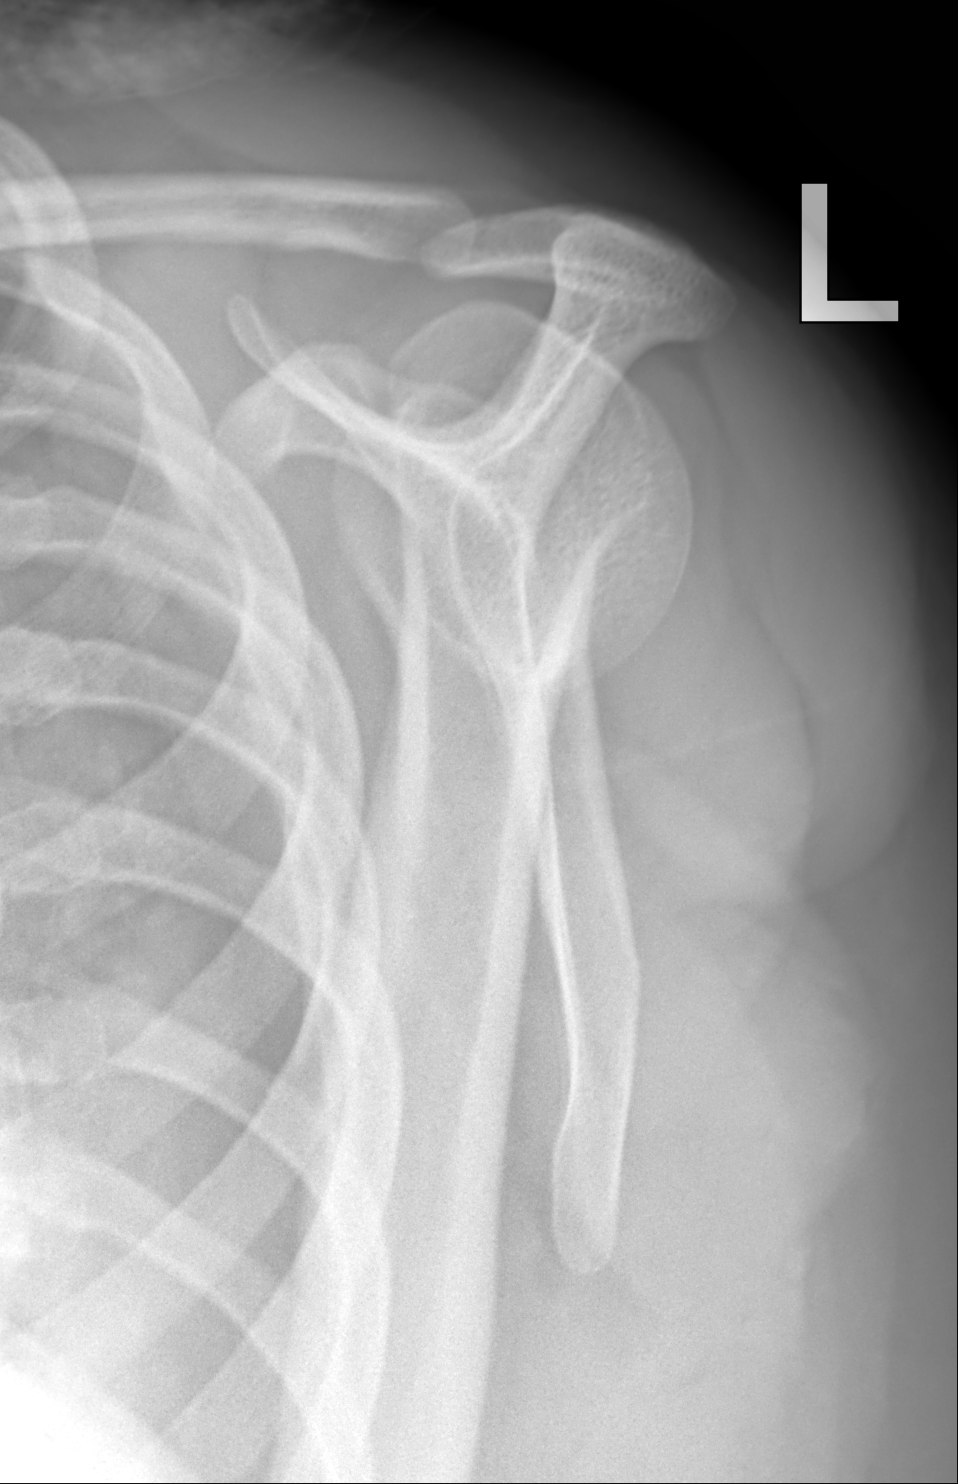

[shoulder axial 5° seated]
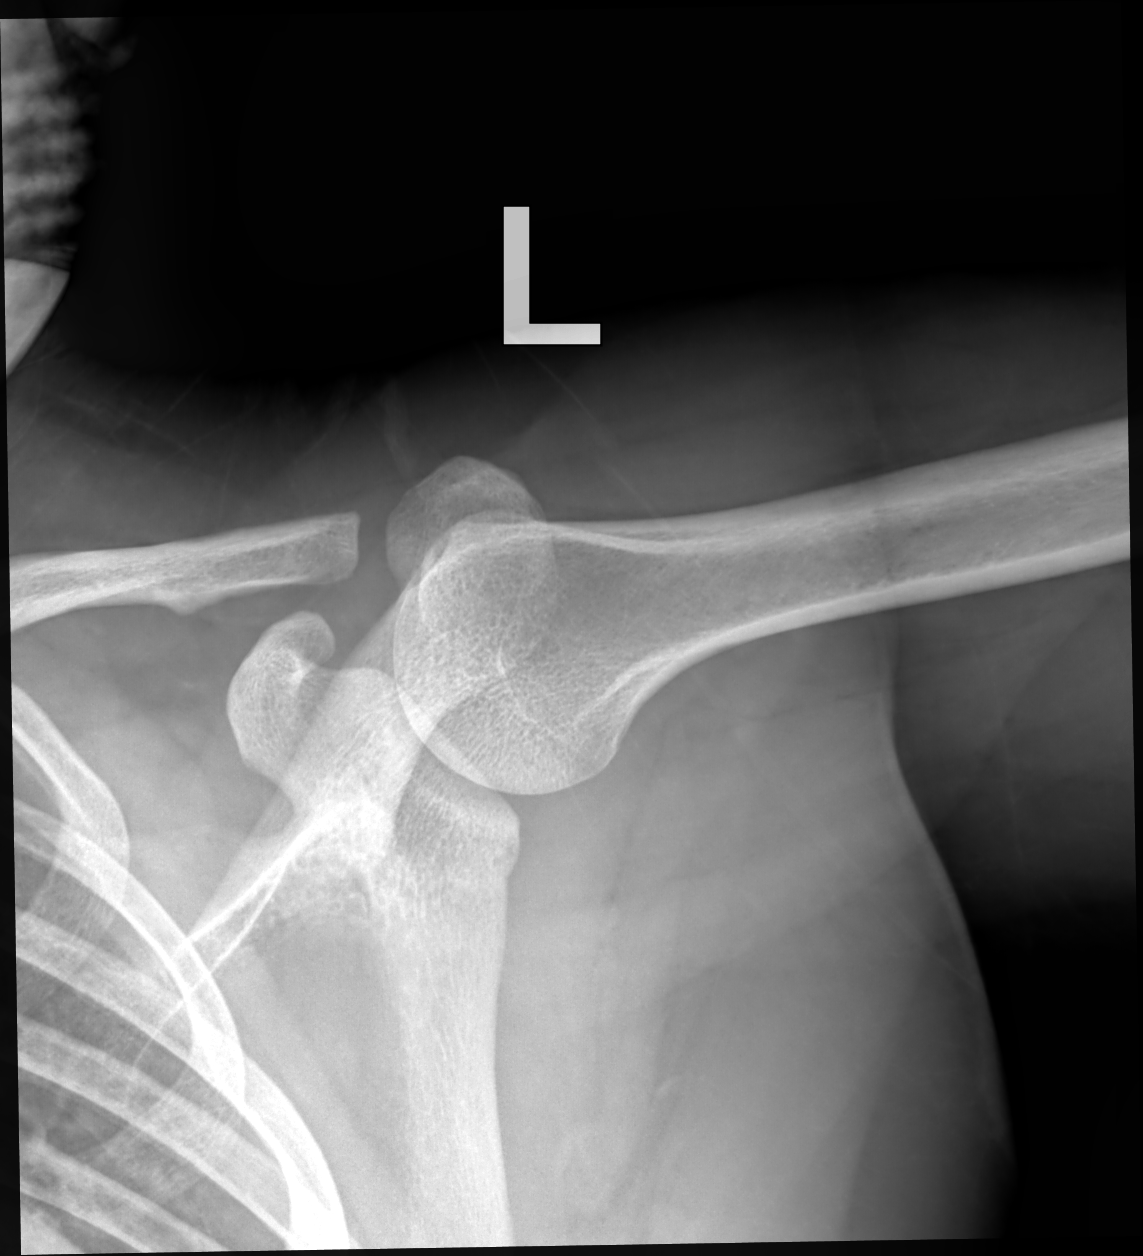

[3 of 3 positions shown; findings below may reference images not displayed]

FINDINGS: There is no evidence of fracture or dislocation. There is no
evidence of arthropathy or other focal bone abnormality. Soft
tissues are unremarkable.
IMPRESSION: Negative.

## 2023-12-30 ENCOUNTER — Ambulatory Visit: Admitting: Obstetrics & Gynecology

## 2023-12-30 VITALS — BP 116/82 | HR 114 | Wt 193.0 lb

## 2023-12-30 DIAGNOSIS — Z3046 Encounter for surveillance of implantable subdermal contraceptive: Secondary | ICD-10-CM

## 2023-12-30 DIAGNOSIS — Z30011 Encounter for initial prescription of contraceptive pills: Secondary | ICD-10-CM

## 2023-12-30 MED ORDER — DROSPIRENONE-ETHINYL ESTRADIOL 3-0.02 MG PO TABS
1.0000 | ORAL_TABLET | Freq: Every day | ORAL | 11 refills | Status: AC
Start: 2023-12-30 — End: ?

## 2023-12-30 NOTE — Progress Notes (Signed)
 Pt is in office for Nexplanon  removal, pt is interested in taking OCP. Pt states she has gained weight and has noticeable changes in appetite.

## 2023-12-30 NOTE — Progress Notes (Signed)
 GYNECOLOGY OFFICE PROCEDURE NOTE  Anna Sanders is a 23 y.o. G0P0000 here for Nexplanon  removal.  Last pap smear was on 2024 and was normal.  No other gynecologic concerns.  Nexplanon  Removal Patient identified, informed consent performed, consent signed.   Appropriate time out taken. Nexplanon  site identified.  Area prepped in usual sterile fashon. One ml of 1% lidocaine  was used to anesthetize the area at the distal end of the implant. A small stab incision was made right beside the implant on the distal portion.  The Nexplanon  rod was grasped using hemostats and removed without difficulty.  There was minimal blood loss. There were no complications.  3 ml of 1% lidocaine  was injected around the incision for post-procedure analgesia.  Steri-strips were applied over the small incision.  A pressure bandage was applied to reduce any bruising.  The patient tolerated the procedure well and was given post procedure instructions.  Patient is planning to use OCP for contraception. Backup contraception or abstinence for one week  Meds ordered this encounter  Medications   drospirenone-ethinyl estradiol (YAZ) 3-0.02 MG tablet    Sig: Take 1 tablet by mouth daily.    Dispense:  28 tablet    Refill:  11       .    Tresia Fruit, MD Attending Obstetrician & Gynecologist, [facility]  12/30/2023

## 2024-01-27 ENCOUNTER — Other Ambulatory Visit: Payer: Self-pay | Admitting: Obstetrics & Gynecology

## 2024-01-27 ENCOUNTER — Ambulatory Visit (HOSPITAL_BASED_OUTPATIENT_CLINIC_OR_DEPARTMENT_OTHER): Attending: Physician Assistant | Admitting: Physical Therapy

## 2024-01-27 ENCOUNTER — Other Ambulatory Visit: Payer: Self-pay

## 2024-01-27 ENCOUNTER — Encounter (HOSPITAL_BASED_OUTPATIENT_CLINIC_OR_DEPARTMENT_OTHER): Payer: Self-pay | Admitting: Physical Therapy

## 2024-01-27 DIAGNOSIS — R2689 Other abnormalities of gait and mobility: Secondary | ICD-10-CM | POA: Diagnosis present

## 2024-01-27 DIAGNOSIS — R293 Abnormal posture: Secondary | ICD-10-CM | POA: Diagnosis present

## 2024-01-27 DIAGNOSIS — M5459 Other low back pain: Secondary | ICD-10-CM | POA: Diagnosis present
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
## 2024-02-01 ENCOUNTER — Ambulatory Visit (HOSPITAL_BASED_OUTPATIENT_CLINIC_OR_DEPARTMENT_OTHER): Admitting: Physical Therapy

## 2024-02-05 ENCOUNTER — Ambulatory Visit (HOSPITAL_BASED_OUTPATIENT_CLINIC_OR_DEPARTMENT_OTHER): Admitting: Physical Therapy

## 2024-02-05 DIAGNOSIS — M5459 Other low back pain: Secondary | ICD-10-CM | POA: Diagnosis not present

## 2024-02-05 DIAGNOSIS — R2689 Other abnormalities of gait and mobility: Secondary | ICD-10-CM

## 2024-02-05 DIAGNOSIS — R293 Abnormal posture: Secondary | ICD-10-CM
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
## 2024-02-09 ENCOUNTER — Encounter (HOSPITAL_BASED_OUTPATIENT_CLINIC_OR_DEPARTMENT_OTHER): Payer: Self-pay | Admitting: Rehabilitative and Restorative Service Providers"

## 2024-02-09 ENCOUNTER — Ambulatory Visit (HOSPITAL_BASED_OUTPATIENT_CLINIC_OR_DEPARTMENT_OTHER): Admitting: Rehabilitative and Restorative Service Providers"

## 2024-02-09 DIAGNOSIS — R293 Abnormal posture: Secondary | ICD-10-CM

## 2024-02-09 DIAGNOSIS — R2689 Other abnormalities of gait and mobility: Secondary | ICD-10-CM

## 2024-02-09 DIAGNOSIS — M5459 Other low back pain: Secondary | ICD-10-CM
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
## 2024-02-12 ENCOUNTER — Ambulatory Visit (HOSPITAL_BASED_OUTPATIENT_CLINIC_OR_DEPARTMENT_OTHER): Admitting: Physical Therapy

## 2024-02-12 ENCOUNTER — Encounter (HOSPITAL_BASED_OUTPATIENT_CLINIC_OR_DEPARTMENT_OTHER): Payer: Self-pay | Admitting: Physical Therapy

## 2024-02-12 DIAGNOSIS — R2689 Other abnormalities of gait and mobility: Secondary | ICD-10-CM

## 2024-02-12 DIAGNOSIS — R293 Abnormal posture: Secondary | ICD-10-CM

## 2024-02-12 DIAGNOSIS — M5459 Other low back pain: Secondary | ICD-10-CM
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
## 2024-02-17 ENCOUNTER — Ambulatory Visit (HOSPITAL_BASED_OUTPATIENT_CLINIC_OR_DEPARTMENT_OTHER)

## 2024-02-17 ENCOUNTER — Encounter (HOSPITAL_BASED_OUTPATIENT_CLINIC_OR_DEPARTMENT_OTHER): Payer: Self-pay

## 2024-02-17 DIAGNOSIS — R2689 Other abnormalities of gait and mobility: Secondary | ICD-10-CM

## 2024-02-17 DIAGNOSIS — M5459 Other low back pain: Secondary | ICD-10-CM

## 2024-02-17 DIAGNOSIS — R293 Abnormal posture: Secondary | ICD-10-CM
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
## 2024-02-19 ENCOUNTER — Ambulatory Visit (HOSPITAL_BASED_OUTPATIENT_CLINIC_OR_DEPARTMENT_OTHER): Payer: Self-pay | Attending: Physician Assistant | Admitting: Physical Therapy

## 2024-02-19 DIAGNOSIS — R293 Abnormal posture: Secondary | ICD-10-CM | POA: Insufficient documentation

## 2024-02-19 DIAGNOSIS — M5459 Other low back pain: Secondary | ICD-10-CM | POA: Insufficient documentation

## 2024-02-19 DIAGNOSIS — R2689 Other abnormalities of gait and mobility: Secondary | ICD-10-CM | POA: Insufficient documentation
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
## 2024-02-22 ENCOUNTER — Encounter (HOSPITAL_BASED_OUTPATIENT_CLINIC_OR_DEPARTMENT_OTHER): Payer: Self-pay

## 2024-02-22 ENCOUNTER — Ambulatory Visit (HOSPITAL_BASED_OUTPATIENT_CLINIC_OR_DEPARTMENT_OTHER)

## 2024-02-22 DIAGNOSIS — R2689 Other abnormalities of gait and mobility: Secondary | ICD-10-CM

## 2024-02-22 DIAGNOSIS — R293 Abnormal posture: Secondary | ICD-10-CM | POA: Diagnosis not present

## 2024-02-22 DIAGNOSIS — M5459 Other low back pain: Secondary | ICD-10-CM
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
## 2024-02-24 ENCOUNTER — Ambulatory Visit (HOSPITAL_BASED_OUTPATIENT_CLINIC_OR_DEPARTMENT_OTHER): Admitting: Physical Therapy

## 2024-02-24 ENCOUNTER — Encounter (HOSPITAL_BASED_OUTPATIENT_CLINIC_OR_DEPARTMENT_OTHER): Payer: Self-pay | Admitting: Physical Therapy

## 2024-02-24 DIAGNOSIS — R293 Abnormal posture: Secondary | ICD-10-CM | POA: Diagnosis not present

## 2024-02-24 DIAGNOSIS — M5459 Other low back pain: Secondary | ICD-10-CM

## 2024-02-24 DIAGNOSIS — R2689 Other abnormalities of gait and mobility: Secondary | ICD-10-CM
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
## 2024-02-29 ENCOUNTER — Ambulatory Visit (HOSPITAL_BASED_OUTPATIENT_CLINIC_OR_DEPARTMENT_OTHER)

## 2024-02-29 ENCOUNTER — Encounter (HOSPITAL_BASED_OUTPATIENT_CLINIC_OR_DEPARTMENT_OTHER): Payer: Self-pay

## 2024-02-29 DIAGNOSIS — R293 Abnormal posture: Secondary | ICD-10-CM

## 2024-02-29 DIAGNOSIS — R2689 Other abnormalities of gait and mobility: Secondary | ICD-10-CM

## 2024-02-29 DIAGNOSIS — M5459 Other low back pain: Secondary | ICD-10-CM
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
## 2024-03-02 ENCOUNTER — Ambulatory Visit (HOSPITAL_BASED_OUTPATIENT_CLINIC_OR_DEPARTMENT_OTHER): Admitting: Physical Therapy

## 2024-03-02 ENCOUNTER — Encounter (HOSPITAL_BASED_OUTPATIENT_CLINIC_OR_DEPARTMENT_OTHER): Payer: Self-pay | Admitting: Physical Therapy

## 2024-03-02 DIAGNOSIS — M5459 Other low back pain: Secondary | ICD-10-CM

## 2024-03-02 DIAGNOSIS — R293 Abnormal posture: Secondary | ICD-10-CM

## 2024-03-02 DIAGNOSIS — R2689 Other abnormalities of gait and mobility: Secondary | ICD-10-CM
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
## 2024-04-28 ENCOUNTER — Encounter: Payer: Self-pay | Admitting: *Deleted

## 2024-04-28 ENCOUNTER — Ambulatory Visit
Admission: EM | Admit: 2024-04-28 | Discharge: 2024-04-28 | Disposition: A | Discharge status: Home / Self Care | Attending: Family Medicine | Admitting: Family Medicine

## 2024-04-28 ENCOUNTER — Other Ambulatory Visit: Payer: Self-pay

## 2024-04-28 DIAGNOSIS — J4521 Mild intermittent asthma with (acute) exacerbation: Secondary | ICD-10-CM

## 2024-04-28 DIAGNOSIS — J0100 Acute maxillary sinusitis, unspecified: Secondary | ICD-10-CM | POA: Diagnosis not present

## 2024-04-28 MED ORDER — PREDNISONE 20 MG PO TABS: 40.0000 mg | ORAL_TABLET | Freq: Every day | ORAL | 0 refills | Status: AC

## 2024-04-28 MED ORDER — AMOXICILLIN 875 MG PO TABS: 875.0000 mg | ORAL_TABLET | Freq: Two times a day (BID) | ORAL | 0 refills | Status: AC

## 2024-04-28 NOTE — ED Triage Notes (Signed)
 Pt reports congestion 2 weeks ago- states she thought it was her allergies. Last Friday she began having a sore throat and tried multiple OTC meds and her inhalers. Today she felt sick on her stomach and her mouth has been watering a lot. Reports emesis x 3 this morning.

## 2024-05-03 NOTE — ED Provider Notes (Signed)
 [facility]   [id number] 04/28/24 Arrival Time: 1932  ASSESSMENT & PLAN:  1. Acute non-recurrent maxillary sinusitis   2. Mild intermittent asthma with acute exacerbation    Without resp distress. Begin: Meds ordered this encounter  Medications   predniSONE  (DELTASONE ) 20 MG tablet    Sig: Take 2 tablets (40 mg total) by mouth daily.    Dispense:  10 tablet    Refill:  0   amoxicillin (AMOXIL) 875 MG tablet    Sig: Take 1 tablet (875 mg total) by mouth 2 (two) times daily for 7 days.    Dispense:  14 tablet    Refill:  0     Follow-up Information     Jesus Elberta Gainer, FNP.   Specialty: Family Medicine Why: As needed. Contact information: [id number] [place] [address] [phone]                 Reviewed expectations re: course of current medical issues. Questions answered. Outlined signs and symptoms indicating need for more acute intervention. Understanding verbalized. After Visit Summary given.   SUBJECTIVE: History from: Patient. Anna Sanders is a 23 y.o. female. Pt reports congestion and sinus pressure 2 weeks ago- states she thought it was her allergies. Last Friday she began having a sore throat and tried multiple OTC meds and her inhalers. Today she felt sick on her stomach and her mouth has been watering a lot. Reports emesis x 3 this morning. Denies: fever. Normal PO intake without n/v/d. Has been wheezing. H/O asthma.  OBJECTIVE:  Vitals:   04/28/24 1942  BP: 119/83  Pulse: 96  Resp: 20  Temp: 98.4 F (36.9 C)  TempSrc: Oral  SpO2: 95%    General appearance: alert; no distress Eyes: PERRLA; EOMI; conjunctiva normal HENT: [place]; AT; with nasal congestion; with max sinus pressure and TTP Neck: supple  Lungs: speaks full sentences without difficulty; unlabored Extremities: no edema Skin: warm and dry Neurologic: normal gait Psychological: alert and cooperative; normal mood and affect  Labs:  Labs  Reviewed - No data to display  Imaging: No results found.  Allergies  Allergen Reactions   Apple     Other reaction(s): Other (See Comments) Face breaks out   Apple Juice     Face breaks out   Latex     itching Other reaction(s): Other (See Comments) itching    Past Medical History:  Diagnosis Date   Asthma    Pre-diabetes    Seasonal allergies    Social History   Socioeconomic History   Marital status: Single    Spouse name: Not on file   Number of children: Not on file   Years of education: current 11th grade   Highest education level: 10th grade  Occupational History    Employer: [employer]   Occupation: Lobbyist: [employer]    Comment: Personal Shopper  Tobacco Use   Smoking status: Never    Passive exposure: Yes   Smokeless tobacco: Never  Vaping Use   Vaping status: Former  Substance and Sexual Activity   Alcohol use: Not Currently    Comment: Occ.   Drug use: Never   Sexual activity: Yes    Birth control/protection: Pill  Other Topics Concern   Not on file  Social History Narrative   Freshman at [employer]    Social Drivers of Health   Financial Resource Strain: Not on file  Food Insecurity: Low Risk  (  02/05/2023)   Received from [facility]   Hunger Vital Sign    Within the past 12 months, you worried that your food would run out before you got money to buy more: Never true    Within the past 12 months, the food you bought just didn't last and you didn't have money to get more. : Never true  Transportation Needs: Not on file (02/05/2023)  Physical Activity: Not on file  Stress: Not on file  Social Connections: Not on file  Intimate Partner Violence: Not on file   Family History  Problem Relation Age of Onset   Asthma Other    Hypertension Other    Diabetes Other    Hyperlipidemia Other    Past Surgical History:  Procedure Laterality Date   COMBINED REDUCTION MAMMAPLASTY W/ ABDOMINOPLASTY Bilateral      Rolinda Rogue,  MD 05/03/24 [phone]
